# Patient Record
Sex: Female | Born: 1995 | Race: White | Hispanic: No | Marital: Single | Smoking: Former smoker
Health system: Southern US, Community
[De-identification: ages and names within clinical notes are randomized; demographics above are authoritative.]

## PROBLEM LIST (undated history)

## (undated) DIAGNOSIS — Z789 Other specified health status: Secondary | ICD-10-CM

## (undated) DIAGNOSIS — R112 Nausea with vomiting, unspecified: Secondary | ICD-10-CM

## (undated) DIAGNOSIS — Z9889 Other specified postprocedural states: Secondary | ICD-10-CM

## (undated) HISTORY — DX: Other specified postprocedural states: R11.2

## (undated) HISTORY — PX: NO PAST SURGERIES: SHX2092

## (undated) HISTORY — PX: APPENDECTOMY: SHX54

---

## 2018-03-24 DIAGNOSIS — O211 Hyperemesis gravidarum with metabolic disturbance: Secondary | ICD-10-CM | POA: Diagnosis not present

## 2018-03-24 DIAGNOSIS — O99511 Diseases of the respiratory system complicating pregnancy, first trimester: Secondary | ICD-10-CM | POA: Diagnosis not present

## 2018-03-24 DIAGNOSIS — J111 Influenza due to unidentified influenza virus with other respiratory manifestations: Secondary | ICD-10-CM | POA: Diagnosis not present

## 2018-03-24 DIAGNOSIS — O99281 Endocrine, nutritional and metabolic diseases complicating pregnancy, first trimester: Secondary | ICD-10-CM | POA: Diagnosis not present

## 2018-03-24 DIAGNOSIS — J101 Influenza due to other identified influenza virus with other respiratory manifestations: Secondary | ICD-10-CM | POA: Diagnosis not present

## 2018-03-24 DIAGNOSIS — Z3A08 8 weeks gestation of pregnancy: Secondary | ICD-10-CM | POA: Diagnosis not present

## 2018-03-24 DIAGNOSIS — E860 Dehydration: Secondary | ICD-10-CM | POA: Diagnosis not present

## 2018-04-24 DIAGNOSIS — Z87891 Personal history of nicotine dependence: Secondary | ICD-10-CM | POA: Diagnosis not present

## 2018-04-24 DIAGNOSIS — M549 Dorsalgia, unspecified: Secondary | ICD-10-CM | POA: Diagnosis not present

## 2018-04-24 DIAGNOSIS — R1030 Lower abdominal pain, unspecified: Secondary | ICD-10-CM | POA: Diagnosis not present

## 2018-04-24 DIAGNOSIS — R109 Unspecified abdominal pain: Secondary | ICD-10-CM | POA: Diagnosis not present

## 2018-04-24 DIAGNOSIS — O26891 Other specified pregnancy related conditions, first trimester: Secondary | ICD-10-CM | POA: Diagnosis not present

## 2018-04-24 DIAGNOSIS — Z3A12 12 weeks gestation of pregnancy: Secondary | ICD-10-CM | POA: Diagnosis not present

## 2018-04-30 DIAGNOSIS — N912 Amenorrhea, unspecified: Secondary | ICD-10-CM | POA: Diagnosis not present

## 2018-05-06 DIAGNOSIS — Z3492 Encounter for supervision of normal pregnancy, unspecified, second trimester: Secondary | ICD-10-CM | POA: Diagnosis not present

## 2018-07-19 DIAGNOSIS — Z363 Encounter for antenatal screening for malformations: Secondary | ICD-10-CM | POA: Diagnosis not present

## 2018-07-19 DIAGNOSIS — M5441 Lumbago with sciatica, right side: Secondary | ICD-10-CM | POA: Diagnosis not present

## 2018-07-19 DIAGNOSIS — M9903 Segmental and somatic dysfunction of lumbar region: Secondary | ICD-10-CM | POA: Diagnosis not present

## 2018-07-19 DIAGNOSIS — M9902 Segmental and somatic dysfunction of thoracic region: Secondary | ICD-10-CM | POA: Diagnosis not present

## 2018-07-19 DIAGNOSIS — M9905 Segmental and somatic dysfunction of pelvic region: Secondary | ICD-10-CM | POA: Diagnosis not present

## 2018-07-23 DIAGNOSIS — M9903 Segmental and somatic dysfunction of lumbar region: Secondary | ICD-10-CM | POA: Diagnosis not present

## 2018-07-23 DIAGNOSIS — M9905 Segmental and somatic dysfunction of pelvic region: Secondary | ICD-10-CM | POA: Diagnosis not present

## 2018-07-23 DIAGNOSIS — M5441 Lumbago with sciatica, right side: Secondary | ICD-10-CM | POA: Diagnosis not present

## 2018-07-23 DIAGNOSIS — M9902 Segmental and somatic dysfunction of thoracic region: Secondary | ICD-10-CM | POA: Diagnosis not present

## 2018-07-26 DIAGNOSIS — Z36 Encounter for antenatal screening for chromosomal anomalies: Secondary | ICD-10-CM | POA: Diagnosis not present

## 2018-07-26 DIAGNOSIS — Z3A26 26 weeks gestation of pregnancy: Secondary | ICD-10-CM | POA: Diagnosis not present

## 2018-07-26 DIAGNOSIS — M9905 Segmental and somatic dysfunction of pelvic region: Secondary | ICD-10-CM | POA: Diagnosis not present

## 2018-07-26 DIAGNOSIS — M9902 Segmental and somatic dysfunction of thoracic region: Secondary | ICD-10-CM | POA: Diagnosis not present

## 2018-07-26 DIAGNOSIS — Z363 Encounter for antenatal screening for malformations: Secondary | ICD-10-CM | POA: Diagnosis not present

## 2018-07-26 DIAGNOSIS — O350XX0 Maternal care for (suspected) central nervous system malformation in fetus, not applicable or unspecified: Secondary | ICD-10-CM | POA: Diagnosis not present

## 2018-07-26 DIAGNOSIS — M9903 Segmental and somatic dysfunction of lumbar region: Secondary | ICD-10-CM | POA: Diagnosis not present

## 2018-07-26 DIAGNOSIS — M5441 Lumbago with sciatica, right side: Secondary | ICD-10-CM | POA: Diagnosis not present

## 2018-07-29 DIAGNOSIS — M9903 Segmental and somatic dysfunction of lumbar region: Secondary | ICD-10-CM | POA: Diagnosis not present

## 2018-07-29 DIAGNOSIS — M5441 Lumbago with sciatica, right side: Secondary | ICD-10-CM | POA: Diagnosis not present

## 2018-07-29 DIAGNOSIS — M9905 Segmental and somatic dysfunction of pelvic region: Secondary | ICD-10-CM | POA: Diagnosis not present

## 2018-07-29 DIAGNOSIS — M9902 Segmental and somatic dysfunction of thoracic region: Secondary | ICD-10-CM | POA: Diagnosis not present

## 2018-07-31 DIAGNOSIS — M9903 Segmental and somatic dysfunction of lumbar region: Secondary | ICD-10-CM | POA: Diagnosis not present

## 2018-07-31 DIAGNOSIS — M9902 Segmental and somatic dysfunction of thoracic region: Secondary | ICD-10-CM | POA: Diagnosis not present

## 2018-07-31 DIAGNOSIS — M9905 Segmental and somatic dysfunction of pelvic region: Secondary | ICD-10-CM | POA: Diagnosis not present

## 2018-07-31 DIAGNOSIS — M5441 Lumbago with sciatica, right side: Secondary | ICD-10-CM | POA: Diagnosis not present

## 2018-08-05 DIAGNOSIS — M9902 Segmental and somatic dysfunction of thoracic region: Secondary | ICD-10-CM | POA: Diagnosis not present

## 2018-08-05 DIAGNOSIS — M9905 Segmental and somatic dysfunction of pelvic region: Secondary | ICD-10-CM | POA: Diagnosis not present

## 2018-08-05 DIAGNOSIS — M5441 Lumbago with sciatica, right side: Secondary | ICD-10-CM | POA: Diagnosis not present

## 2018-08-05 DIAGNOSIS — M9903 Segmental and somatic dysfunction of lumbar region: Secondary | ICD-10-CM | POA: Diagnosis not present

## 2018-08-09 DIAGNOSIS — Z3480 Encounter for supervision of other normal pregnancy, unspecified trimester: Secondary | ICD-10-CM | POA: Diagnosis not present

## 2018-08-12 DIAGNOSIS — M9902 Segmental and somatic dysfunction of thoracic region: Secondary | ICD-10-CM | POA: Diagnosis not present

## 2018-08-12 DIAGNOSIS — M5441 Lumbago with sciatica, right side: Secondary | ICD-10-CM | POA: Diagnosis not present

## 2018-08-12 DIAGNOSIS — M9903 Segmental and somatic dysfunction of lumbar region: Secondary | ICD-10-CM | POA: Diagnosis not present

## 2018-08-12 DIAGNOSIS — M9905 Segmental and somatic dysfunction of pelvic region: Secondary | ICD-10-CM | POA: Diagnosis not present

## 2018-08-21 DIAGNOSIS — M9903 Segmental and somatic dysfunction of lumbar region: Secondary | ICD-10-CM | POA: Diagnosis not present

## 2018-08-21 DIAGNOSIS — M5441 Lumbago with sciatica, right side: Secondary | ICD-10-CM | POA: Diagnosis not present

## 2018-08-21 DIAGNOSIS — M9902 Segmental and somatic dysfunction of thoracic region: Secondary | ICD-10-CM | POA: Diagnosis not present

## 2018-08-21 DIAGNOSIS — M9905 Segmental and somatic dysfunction of pelvic region: Secondary | ICD-10-CM | POA: Diagnosis not present

## 2018-08-22 DIAGNOSIS — Z23 Encounter for immunization: Secondary | ICD-10-CM | POA: Diagnosis not present

## 2018-08-26 DIAGNOSIS — M9903 Segmental and somatic dysfunction of lumbar region: Secondary | ICD-10-CM | POA: Diagnosis not present

## 2018-08-26 DIAGNOSIS — M5441 Lumbago with sciatica, right side: Secondary | ICD-10-CM | POA: Diagnosis not present

## 2018-08-26 DIAGNOSIS — M9902 Segmental and somatic dysfunction of thoracic region: Secondary | ICD-10-CM | POA: Diagnosis not present

## 2018-08-26 DIAGNOSIS — Z3A31 31 weeks gestation of pregnancy: Secondary | ICD-10-CM | POA: Diagnosis not present

## 2018-08-26 DIAGNOSIS — Z3689 Encounter for other specified antenatal screening: Secondary | ICD-10-CM | POA: Diagnosis not present

## 2018-08-26 DIAGNOSIS — M9905 Segmental and somatic dysfunction of pelvic region: Secondary | ICD-10-CM | POA: Diagnosis not present

## 2018-09-04 DIAGNOSIS — M9905 Segmental and somatic dysfunction of pelvic region: Secondary | ICD-10-CM | POA: Diagnosis not present

## 2018-09-04 DIAGNOSIS — M9903 Segmental and somatic dysfunction of lumbar region: Secondary | ICD-10-CM | POA: Diagnosis not present

## 2018-09-04 DIAGNOSIS — M9902 Segmental and somatic dysfunction of thoracic region: Secondary | ICD-10-CM | POA: Diagnosis not present

## 2018-09-04 DIAGNOSIS — M5441 Lumbago with sciatica, right side: Secondary | ICD-10-CM | POA: Diagnosis not present

## 2018-10-02 DIAGNOSIS — Z3480 Encounter for supervision of other normal pregnancy, unspecified trimester: Secondary | ICD-10-CM | POA: Diagnosis not present

## 2018-10-18 ENCOUNTER — Inpatient Hospital Stay (HOSPITAL_COMMUNITY): Payer: Medicaid Other

## 2018-10-18 ENCOUNTER — Inpatient Hospital Stay (HOSPITAL_COMMUNITY): Payer: Medicaid Other | Admitting: Anesthesiology

## 2018-10-18 ENCOUNTER — Encounter (HOSPITAL_COMMUNITY)
Admission: AD | Disposition: A | Discharge status: Home / Self Care | Payer: Self-pay | Source: Home / Self Care | Attending: Obstetrics and Gynecology

## 2018-10-18 ENCOUNTER — Encounter (HOSPITAL_COMMUNITY): Payer: Self-pay | Admitting: *Deleted

## 2018-10-18 ENCOUNTER — Other Ambulatory Visit: Payer: Self-pay

## 2018-10-18 ENCOUNTER — Inpatient Hospital Stay (HOSPITAL_COMMUNITY)
Admission: AD | Admit: 2018-10-18 | Discharge: 2018-10-21 | DRG: 788 | Disposition: A | Discharge status: Home / Self Care | Payer: Medicaid Other | Attending: Obstetrics and Gynecology | Admitting: Obstetrics and Gynecology

## 2018-10-18 DIAGNOSIS — O9081 Anemia of the puerperium: Secondary | ICD-10-CM | POA: Diagnosis not present

## 2018-10-18 DIAGNOSIS — O26893 Other specified pregnancy related conditions, third trimester: Secondary | ICD-10-CM | POA: Diagnosis present

## 2018-10-18 DIAGNOSIS — O411230 Chorioamnionitis, third trimester, not applicable or unspecified: Secondary | ICD-10-CM | POA: Diagnosis not present

## 2018-10-18 DIAGNOSIS — Z3A38 38 weeks gestation of pregnancy: Secondary | ICD-10-CM

## 2018-10-18 DIAGNOSIS — O99824 Streptococcus B carrier state complicating childbirth: Secondary | ICD-10-CM | POA: Diagnosis not present

## 2018-10-18 DIAGNOSIS — Z3A00 Weeks of gestation of pregnancy not specified: Secondary | ICD-10-CM | POA: Diagnosis not present

## 2018-10-18 DIAGNOSIS — Z87891 Personal history of nicotine dependence: Secondary | ICD-10-CM | POA: Diagnosis not present

## 2018-10-18 DIAGNOSIS — O289 Unspecified abnormal findings on antenatal screening of mother: Secondary | ICD-10-CM | POA: Diagnosis not present

## 2018-10-18 DIAGNOSIS — Z20828 Contact with and (suspected) exposure to other viral communicable diseases: Secondary | ICD-10-CM | POA: Diagnosis present

## 2018-10-18 DIAGNOSIS — O368390 Maternal care for abnormalities of the fetal heart rate or rhythm, unspecified trimester, not applicable or unspecified: Secondary | ICD-10-CM | POA: Diagnosis present

## 2018-10-18 HISTORY — DX: Other specified health status: Z78.9

## 2018-10-18 LAB — CBC
HCT: 35.3 % — ABNORMAL LOW (ref 36.0–46.0)
Hemoglobin: 12 g/dL (ref 12.0–15.0)
MCH: 30.4 pg (ref 26.0–34.0)
MCHC: 34 g/dL (ref 30.0–36.0)
MCV: 89.4 fL (ref 80.0–100.0)
Platelets: 252 10*3/uL (ref 150–400)
RBC: 3.95 MIL/uL (ref 3.87–5.11)
RDW: 13.1 % (ref 11.5–15.5)
WBC: 15.2 10*3/uL — ABNORMAL HIGH (ref 4.0–10.5)
nRBC: 0 % (ref 0.0–0.2)

## 2018-10-18 LAB — TYPE AND SCREEN
ABO/RH(D): A POS
Antibody Screen: NEGATIVE

## 2018-10-18 LAB — SARS CORONAVIRUS 2 BY RT PCR (HOSPITAL ORDER, PERFORMED IN ~~LOC~~ HOSPITAL LAB): SARS Coronavirus 2: NEGATIVE

## 2018-10-18 LAB — ABO/RH: ABO/RH(D): A POS

## 2018-10-18 SURGERY — Surgical Case
Anesthesia: Spinal

## 2018-10-18 MED ORDER — NALBUPHINE HCL 10 MG/ML IJ SOLN: 5.0000 mg | INTRAMUSCULAR | Status: DC | PRN

## 2018-10-18 MED ORDER — SIMETHICONE 80 MG PO CHEW
80.0000 mg | CHEWABLE_TABLET | Freq: Three times a day (TID) | ORAL | Status: DC
  Administered 2018-10-19 – 2018-10-21 (×7): 80 mg via ORAL
  Filled 2018-10-18 (×7): qty 1

## 2018-10-18 MED ORDER — PHENYLEPHRINE HCL-NACL 20-0.9 MG/250ML-% IV SOLN
INTRAVENOUS | Status: DC | PRN
  Administered 2018-10-18: 60 ug/min via INTRAVENOUS

## 2018-10-18 MED ORDER — DIPHENHYDRAMINE HCL 50 MG/ML IJ SOLN
INTRAMUSCULAR | Status: AC
  Filled 2018-10-18: qty 1

## 2018-10-18 MED ORDER — FLEET ENEMA 7-19 GM/118ML RE ENEM: 1.0000 | ENEMA | RECTAL | Status: DC | PRN

## 2018-10-18 MED ORDER — SCOPOLAMINE 1 MG/3DAYS TD PT72
MEDICATED_PATCH | TRANSDERMAL | Status: AC
  Filled 2018-10-18: qty 1

## 2018-10-18 MED ORDER — KETOROLAC TROMETHAMINE 30 MG/ML IJ SOLN
30.0000 mg | Freq: Once | INTRAMUSCULAR | Status: AC | PRN
  Administered 2018-10-18: 30 mg via INTRAVENOUS

## 2018-10-18 MED ORDER — NALBUPHINE HCL 10 MG/ML IJ SOLN: 5.0000 mg | Freq: Once | INTRAMUSCULAR | Status: DC | PRN

## 2018-10-18 MED ORDER — KETOROLAC TROMETHAMINE 30 MG/ML IJ SOLN: 30.0000 mg | Freq: Four times a day (QID) | INTRAMUSCULAR | Status: AC | PRN

## 2018-10-18 MED ORDER — SCOPOLAMINE 1 MG/3DAYS TD PT72: 1.0000 | MEDICATED_PATCH | Freq: Once | TRANSDERMAL | Status: DC

## 2018-10-18 MED ORDER — LACTATED RINGERS IV SOLN
INTRAVENOUS | Status: DC
  Administered 2018-10-18 – 2018-10-19 (×2): via INTRAVENOUS

## 2018-10-18 MED ORDER — HYDROMORPHONE HCL 1 MG/ML IJ SOLN
INTRAMUSCULAR | Status: AC
  Filled 2018-10-18: qty 1

## 2018-10-18 MED ORDER — FENTANYL CITRATE (PF) 100 MCG/2ML IJ SOLN
INTRAMUSCULAR | Status: DC | PRN
  Administered 2018-10-18: 15 ug via INTRATHECAL

## 2018-10-18 MED ORDER — SOD CITRATE-CITRIC ACID 500-334 MG/5ML PO SOLN
30.0000 mL | ORAL | Status: DC | PRN
  Administered 2018-10-18: 30 mL via ORAL
  Filled 2018-10-18: qty 30

## 2018-10-18 MED ORDER — KETOROLAC TROMETHAMINE 30 MG/ML IJ SOLN
INTRAMUSCULAR | Status: AC
  Filled 2018-10-18: qty 1

## 2018-10-18 MED ORDER — PHENYLEPHRINE HCL-NACL 20-0.9 MG/250ML-% IV SOLN
INTRAVENOUS | Status: AC
  Filled 2018-10-18: qty 250

## 2018-10-18 MED ORDER — DEXAMETHASONE SODIUM PHOSPHATE 10 MG/ML IJ SOLN
INTRAMUSCULAR | Status: DC | PRN
  Administered 2018-10-18: 10 mg via INTRAVENOUS

## 2018-10-18 MED ORDER — NALOXONE HCL 4 MG/10ML IJ SOLN
1.0000 ug/kg/h | INTRAVENOUS | Status: DC | PRN
  Filled 2018-10-18: qty 5

## 2018-10-18 MED ORDER — PENICILLIN G 3 MILLION UNITS IVPB - SIMPLE MED
3.0000 10*6.[IU] | INTRAVENOUS | Status: DC
  Filled 2018-10-18 (×2): qty 100

## 2018-10-18 MED ORDER — ONDANSETRON HCL 4 MG/2ML IJ SOLN
INTRAMUSCULAR | Status: AC
  Filled 2018-10-18: qty 2

## 2018-10-18 MED ORDER — SODIUM CHLORIDE 0.9 % IV SOLN
INTRAVENOUS | Status: DC | PRN
  Administered 2018-10-18: 15:00:00 via INTRAVENOUS

## 2018-10-18 MED ORDER — OXYTOCIN 40 UNITS IN NORMAL SALINE INFUSION - SIMPLE MED: 2.5000 [IU]/h | INTRAVENOUS | Status: AC

## 2018-10-18 MED ORDER — WITCH HAZEL-GLYCERIN EX PADS: 1.0000 "application " | MEDICATED_PAD | CUTANEOUS | Status: DC | PRN

## 2018-10-18 MED ORDER — ONDANSETRON HCL 4 MG/2ML IJ SOLN: 4.0000 mg | Freq: Four times a day (QID) | INTRAMUSCULAR | Status: DC | PRN

## 2018-10-18 MED ORDER — HYDROMORPHONE HCL 1 MG/ML IJ SOLN
0.2500 mg | INTRAMUSCULAR | Status: DC | PRN
  Administered 2018-10-18 (×2): 0.5 mg via INTRAVENOUS

## 2018-10-18 MED ORDER — SENNOSIDES-DOCUSATE SODIUM 8.6-50 MG PO TABS
2.0000 | ORAL_TABLET | ORAL | Status: DC
  Administered 2018-10-18 – 2018-10-20 (×3): 2 via ORAL
  Filled 2018-10-18 (×3): qty 2

## 2018-10-18 MED ORDER — NALOXONE HCL 0.4 MG/ML IJ SOLN: 0.4000 mg | INTRAMUSCULAR | Status: DC | PRN

## 2018-10-18 MED ORDER — ACETAMINOPHEN 500 MG PO TABS
1000.0000 mg | ORAL_TABLET | Freq: Four times a day (QID) | ORAL | Status: AC
  Administered 2018-10-18 (×2): 1000 mg via ORAL
  Filled 2018-10-18 (×2): qty 2

## 2018-10-18 MED ORDER — CEFAZOLIN SODIUM-DEXTROSE 2-4 GM/100ML-% IV SOLN
INTRAVENOUS | Status: AC
  Filled 2018-10-18: qty 100

## 2018-10-18 MED ORDER — OXYCODONE-ACETAMINOPHEN 5-325 MG PO TABS: 2.0000 | ORAL_TABLET | ORAL | Status: DC | PRN

## 2018-10-18 MED ORDER — LACTATED RINGERS IV SOLN
INTRAVENOUS | Status: DC | PRN
  Administered 2018-10-18 (×2): via INTRAVENOUS

## 2018-10-18 MED ORDER — FENTANYL-BUPIVACAINE-NACL 0.5-0.125-0.9 MG/250ML-% EP SOLN: 12.0000 mL/h | EPIDURAL | Status: DC | PRN

## 2018-10-18 MED ORDER — LACTATED RINGERS IV SOLN
INTRAVENOUS | Status: DC
  Administered 2018-10-18 (×2): via INTRAVENOUS

## 2018-10-18 MED ORDER — MORPHINE SULFATE (PF) 0.5 MG/ML IJ SOLN
INTRAMUSCULAR | Status: DC | PRN
  Administered 2018-10-18: .15 mg via INTRATHECAL

## 2018-10-18 MED ORDER — PHENYLEPHRINE 40 MCG/ML (10ML) SYRINGE FOR IV PUSH (FOR BLOOD PRESSURE SUPPORT): 80.0000 ug | PREFILLED_SYRINGE | INTRAVENOUS | Status: DC | PRN

## 2018-10-18 MED ORDER — IBUPROFEN 800 MG PO TABS
800.0000 mg | ORAL_TABLET | Freq: Three times a day (TID) | ORAL | Status: DC
  Administered 2018-10-18 – 2018-10-21 (×8): 800 mg via ORAL
  Filled 2018-10-18 (×8): qty 1

## 2018-10-18 MED ORDER — DEXAMETHASONE SODIUM PHOSPHATE 10 MG/ML IJ SOLN
INTRAMUSCULAR | Status: AC
  Filled 2018-10-18: qty 1

## 2018-10-18 MED ORDER — SODIUM CHLORIDE 0.9 % IV SOLN
INTRAVENOUS | Status: DC | PRN
  Administered 2018-10-18: 40 [IU] via INTRAVENOUS

## 2018-10-18 MED ORDER — LACTATED RINGERS IV SOLN: 500.0000 mL | INTRAVENOUS | Status: DC | PRN

## 2018-10-18 MED ORDER — LACTATED RINGERS IV SOLN: 500.0000 mL | Freq: Once | INTRAVENOUS | Status: DC

## 2018-10-18 MED ORDER — SIMETHICONE 80 MG PO CHEW
80.0000 mg | CHEWABLE_TABLET | ORAL | Status: DC
  Administered 2018-10-18 – 2018-10-20 (×3): 80 mg via ORAL
  Filled 2018-10-18 (×3): qty 1

## 2018-10-18 MED ORDER — OXYCODONE-ACETAMINOPHEN 5-325 MG PO TABS: 1.0000 | ORAL_TABLET | ORAL | Status: DC | PRN

## 2018-10-18 MED ORDER — SODIUM CHLORIDE 0.9 % IV SOLN: 5.0000 10*6.[IU] | Freq: Once | INTRAVENOUS | Status: DC

## 2018-10-18 MED ORDER — LIDOCAINE HCL (PF) 1 % IJ SOLN: 30.0000 mL | INTRAMUSCULAR | Status: DC | PRN

## 2018-10-18 MED ORDER — STERILE WATER FOR IRRIGATION IR SOLN
Status: DC | PRN
  Administered 2018-10-18: 1

## 2018-10-18 MED ORDER — SODIUM CHLORIDE 0.9 % IR SOLN
Status: DC | PRN
  Administered 2018-10-18: 1

## 2018-10-18 MED ORDER — ZOLPIDEM TARTRATE 5 MG PO TABS: 5.0000 mg | ORAL_TABLET | Freq: Every evening | ORAL | Status: DC | PRN

## 2018-10-18 MED ORDER — EPHEDRINE 5 MG/ML INJ: 10.0000 mg | INTRAVENOUS | Status: DC | PRN

## 2018-10-18 MED ORDER — CEFAZOLIN SODIUM-DEXTROSE 2-4 GM/100ML-% IV SOLN
2.0000 g | Freq: Once | INTRAVENOUS | Status: AC
  Administered 2018-10-18: 2 g via INTRAVENOUS

## 2018-10-18 MED ORDER — PROMETHAZINE HCL 25 MG/ML IJ SOLN: 6.2500 mg | INTRAMUSCULAR | Status: DC | PRN

## 2018-10-18 MED ORDER — DIPHENHYDRAMINE HCL 25 MG PO CAPS: 25.0000 mg | ORAL_CAPSULE | ORAL | Status: DC | PRN

## 2018-10-18 MED ORDER — SCOPOLAMINE 1 MG/3DAYS TD PT72
MEDICATED_PATCH | TRANSDERMAL | Status: DC | PRN
  Administered 2018-10-18: 1 via TRANSDERMAL

## 2018-10-18 MED ORDER — MORPHINE SULFATE (PF) 0.5 MG/ML IJ SOLN
INTRAMUSCULAR | Status: AC
  Filled 2018-10-18: qty 10

## 2018-10-18 MED ORDER — ONDANSETRON HCL 4 MG/2ML IJ SOLN
INTRAMUSCULAR | Status: DC | PRN
  Administered 2018-10-18: 4 mg via INTRAVENOUS

## 2018-10-18 MED ORDER — SODIUM CHLORIDE 0.9% FLUSH: 3.0000 mL | INTRAVENOUS | Status: DC | PRN

## 2018-10-18 MED ORDER — OXYTOCIN BOLUS FROM INFUSION: 500.0000 mL | Freq: Once | INTRAVENOUS | Status: DC

## 2018-10-18 MED ORDER — ACETAMINOPHEN 325 MG PO TABS: 650.0000 mg | ORAL_TABLET | ORAL | Status: DC | PRN

## 2018-10-18 MED ORDER — MEPERIDINE HCL 25 MG/ML IJ SOLN: 6.2500 mg | INTRAMUSCULAR | Status: DC | PRN

## 2018-10-18 MED ORDER — COCONUT OIL OIL
1.0000 "application " | TOPICAL_OIL | Status: DC | PRN
  Administered 2018-10-20: 1 via TOPICAL

## 2018-10-18 MED ORDER — DIBUCAINE (PERIANAL) 1 % EX OINT: 1.0000 "application " | TOPICAL_OINTMENT | CUTANEOUS | Status: DC | PRN

## 2018-10-18 MED ORDER — MEPERIDINE HCL 25 MG/ML IJ SOLN
INTRAMUSCULAR | Status: AC
  Filled 2018-10-18: qty 1

## 2018-10-18 MED ORDER — TETANUS-DIPHTH-ACELL PERTUSSIS 5-2.5-18.5 LF-MCG/0.5 IM SUSP: 0.5000 mL | Freq: Once | INTRAMUSCULAR | Status: DC

## 2018-10-18 MED ORDER — DIPHENHYDRAMINE HCL 50 MG/ML IJ SOLN
12.5000 mg | INTRAMUSCULAR | Status: DC | PRN
  Administered 2018-10-18: 12.5 mg via INTRAVENOUS

## 2018-10-18 MED ORDER — DIPHENHYDRAMINE HCL 25 MG PO CAPS: 25.0000 mg | ORAL_CAPSULE | Freq: Four times a day (QID) | ORAL | Status: DC | PRN

## 2018-10-18 MED ORDER — FENTANYL-BUPIVACAINE-NACL 0.5-0.125-0.9 MG/250ML-% EP SOLN
EPIDURAL | Status: AC
  Filled 2018-10-18: qty 250

## 2018-10-18 MED ORDER — FENTANYL CITRATE (PF) 100 MCG/2ML IJ SOLN
INTRAMUSCULAR | Status: AC
  Filled 2018-10-18: qty 2

## 2018-10-18 MED ORDER — PRENATAL MULTIVITAMIN CH
1.0000 | ORAL_TABLET | Freq: Every day | ORAL | Status: DC
  Administered 2018-10-19 – 2018-10-20 (×2): 1 via ORAL
  Filled 2018-10-18 (×2): qty 1

## 2018-10-18 MED ORDER — MEPERIDINE HCL 25 MG/ML IJ SOLN
INTRAMUSCULAR | Status: DC | PRN
  Administered 2018-10-18: 12.5 mg via INTRAVENOUS

## 2018-10-18 MED ORDER — SIMETHICONE 80 MG PO CHEW: 80.0000 mg | CHEWABLE_TABLET | ORAL | Status: DC | PRN

## 2018-10-18 MED ORDER — MENTHOL 3 MG MT LOZG: 1.0000 | LOZENGE | OROMUCOSAL | Status: DC | PRN

## 2018-10-18 MED ORDER — BUPIVACAINE IN DEXTROSE 0.75-8.25 % IT SOLN
INTRATHECAL | Status: DC | PRN
  Administered 2018-10-18: 1.4 mL via INTRATHECAL

## 2018-10-18 MED ORDER — DIPHENHYDRAMINE HCL 50 MG/ML IJ SOLN: 12.5000 mg | INTRAMUSCULAR | Status: DC | PRN

## 2018-10-18 MED ORDER — ONDANSETRON HCL 4 MG/2ML IJ SOLN: 4.0000 mg | Freq: Three times a day (TID) | INTRAMUSCULAR | Status: DC | PRN

## 2018-10-18 MED ORDER — OXYTOCIN 40 UNITS IN NORMAL SALINE INFUSION - SIMPLE MED: 2.5000 [IU]/h | INTRAVENOUS | Status: DC

## 2018-10-18 MED ORDER — OXYCODONE-ACETAMINOPHEN 5-325 MG PO TABS
1.0000 | ORAL_TABLET | ORAL | Status: DC | PRN
  Administered 2018-10-19: 2 via ORAL
  Administered 2018-10-19: 1 via ORAL
  Administered 2018-10-19: 2 via ORAL
  Administered 2018-10-20 (×2): 1 via ORAL
  Administered 2018-10-20: 2 via ORAL
  Administered 2018-10-20: 1 via ORAL
  Administered 2018-10-20 – 2018-10-21 (×2): 2 via ORAL
  Filled 2018-10-18: qty 2
  Filled 2018-10-18: qty 1
  Filled 2018-10-18: qty 2
  Filled 2018-10-18: qty 1
  Filled 2018-10-18: qty 2
  Filled 2018-10-18: qty 1
  Filled 2018-10-18 (×3): qty 2

## 2018-10-18 SURGICAL SUPPLY — 35 items
BENZOIN TINCTURE PRP APPL 2/3 (GAUZE/BANDAGES/DRESSINGS) ×3 IMPLANT
CHLORAPREP W/TINT 26ML (MISCELLANEOUS) ×3 IMPLANT
CLAMP CORD UMBIL (MISCELLANEOUS) IMPLANT
CLOSURE WOUND 1/2 X4 (GAUZE/BANDAGES/DRESSINGS) ×1
CLOTH BEACON ORANGE TIMEOUT ST (SAFETY) ×3 IMPLANT
DRSG OPSITE POSTOP 4X10 (GAUZE/BANDAGES/DRESSINGS) ×3 IMPLANT
ELECT REM PT RETURN 9FT ADLT (ELECTROSURGICAL) ×3
ELECTRODE REM PT RTRN 9FT ADLT (ELECTROSURGICAL) ×1 IMPLANT
EXTRACTOR VACUUM M CUP 4 TUBE (SUCTIONS) IMPLANT
EXTRACTOR VACUUM M CUP 4' TUBE (SUCTIONS)
GLOVE BIOGEL PI IND STRL 6.5 (GLOVE) ×1 IMPLANT
GLOVE BIOGEL PI IND STRL 7.0 (GLOVE) ×1 IMPLANT
GLOVE BIOGEL PI INDICATOR 6.5 (GLOVE) ×2
GLOVE BIOGEL PI INDICATOR 7.0 (GLOVE) ×2
GLOVE ECLIPSE 6.5 STRL STRAW (GLOVE) ×3 IMPLANT
GOWN STRL REUS W/TWL LRG LVL3 (GOWN DISPOSABLE) ×6 IMPLANT
KIT ABG SYR 3ML LUER SLIP (SYRINGE) IMPLANT
NEEDLE HYPO 25X5/8 SAFETYGLIDE (NEEDLE) IMPLANT
NS IRRIG 1000ML POUR BTL (IV SOLUTION) ×3 IMPLANT
PACK C SECTION WH (CUSTOM PROCEDURE TRAY) ×3 IMPLANT
PAD ABD 7.5X8 STRL (GAUZE/BANDAGES/DRESSINGS) IMPLANT
PAD OB MATERNITY 4.3X12.25 (PERSONAL CARE ITEMS) ×3 IMPLANT
PENCIL SMOKE EVAC W/HOLSTER (ELECTROSURGICAL) ×3 IMPLANT
RTRCTR C-SECT PINK 25CM LRG (MISCELLANEOUS) ×3 IMPLANT
STRIP CLOSURE SKIN 1/2X4 (GAUZE/BANDAGES/DRESSINGS) ×2 IMPLANT
SUT MON AB 2-0 CT1 27 (SUTURE) ×3 IMPLANT
SUT MON AB 4-0 PS1 27 (SUTURE) IMPLANT
SUT PDS AB 0 CTX 60 (SUTURE) IMPLANT
SUT PLAIN 2 0 XLH (SUTURE) IMPLANT
SUT VIC AB 0 CTX 36 (SUTURE) ×8
SUT VIC AB 0 CTX36XBRD ANBCTRL (SUTURE) ×4 IMPLANT
SUT VIC AB 4-0 KS 27 (SUTURE) IMPLANT
TOWEL OR 17X24 6PK STRL BLUE (TOWEL DISPOSABLE) ×3 IMPLANT
TRAY FOLEY W/BAG SLVR 14FR LF (SET/KITS/TRAYS/PACK) ×3 IMPLANT
WATER STERILE IRR 1000ML POUR (IV SOLUTION) ×3 IMPLANT

## 2018-10-18 NOTE — Anesthesia Postprocedure Evaluation (Signed)
Anesthesia Post Note  Patient: Kristina Robinson  Procedure(s) Performed: CESAREAN SECTION (N/A )     Patient location during evaluation: PACU Anesthesia Type: Spinal Level of consciousness: awake Pain management: pain level controlled Vital Signs Assessment: post-procedure vital signs reviewed and stable Respiratory status: spontaneous breathing Cardiovascular status: stable Postop Assessment: no headache, no backache, spinal receding, patient able to bend at knees and no apparent nausea or vomiting Anesthetic complications: no    Last Vitals:  Vitals:   10/18/18 1605 10/18/18 1615  BP:  108/62  Pulse:  78  Resp:  (!) 22  Temp:  36.8 C  SpO2: 100% 100%    Last Pain:  Vitals:   10/18/18 1615  TempSrc: Oral  PainSc:    Pain Goal:                Epidural/Spinal Function Cutaneous sensation: Tingles (10/18/18 1615), Patient able to flex knees: Yes (10/18/18 1615), Patient able to lift hips off bed: No (10/18/18 1615), Back pain beyond tenderness at insertion site: No (10/18/18 1615), Progressively worsening motor and/or sensory loss: No (10/18/18 1615), Bowel and/or bladder incontinence post epidural: No (10/18/18 1615)  Huston Foley

## 2018-10-18 NOTE — H&P (Signed)
23 y.o. 7025w6d  G1P0 comes in c/o ctx.  In MAU FHT found to be non reactive but no decels.  BPP ordered.  At 24min pt refused any further eval d/t discomfort.  At that time ultrasonographer reported 2/8 but incomplete.    Past Medical History:  Diagnosis Date  . Medical history non-contributory     Past Surgical History:  Procedure Laterality Date  . NO PAST SURGERIES      OB History  Gravida Para Term Preterm AB Living  1            SAB TAB Ectopic Multiple Live Births               # Outcome Date GA Lbr Len/2nd Weight Sex Delivery Anes PTL Lv  1 Current             Social History   Socioeconomic History  . Marital status: Single    Spouse name: Not on file  . Number of children: Not on file  . Years of education: Not on file  . Highest education level: Not on file  Occupational History  . Not on file  Social Needs  . Financial resource strain: Not on file  . Food insecurity    Worry: Not on file    Inability: Not on file  . Transportation needs    Medical: Not on file    Non-medical: Not on file  Tobacco Use  . Smoking status: Former Smoker    Types: Cigarettes  . Smokeless tobacco: Never Used  . Tobacco comment: quit Dec  Substance and Sexual Activity  . Alcohol use: Never    Frequency: Never  . Drug use: Never  . Sexual activity: Yes  Lifestyle  . Physical activity    Days per week: Not on file    Minutes per session: Not on file  . Stress: Not on file  Relationships  . Social Musicianconnections    Talks on phone: Not on file    Gets together: Not on file    Attends religious service: Not on file    Active member of club or organization: Not on file    Attends meetings of clubs or organizations: Not on file    Relationship status: Not on file  . Intimate partner violence    Fear of current or ex partner: Not on file    Emotionally abused: Not on file    Physically abused: Not on file    Forced sexual activity: Not on file  Other Topics Concern  . Not on  file  Social History Narrative  . Not on file   Patient has no known allergies.    Prenatal Transfer Tool  Maternal Diabetes: No Genetic Screening: Declined Maternal Ultrasounds/Referrals: Other: ventriculomegaly, f/b scan by MFM showing no abnormality, but limited  Fetal Ultrasounds or other Referrals:  Referred to Materal Fetal Medicine  Maternal Substance Abuse:  No Significant Maternal Medications:  None Significant Maternal Lab Results: Group B Strep positive  Other PNC: uncomplicated.    Vitals:   10/18/18 1006 10/18/18 1014 10/18/18 1029 10/18/18 1148  BP:  128/80 100/70 133/85  Pulse:  97 91 99  Temp:  98.4 F (36.9 C)    TempSrc:  Oral    SpO2:  100% 100%   Weight: 66.5 kg     Height: 5\' 8"  (1.727 m)       Lungs/Cor:  NAD Abdomen:  soft, gravid Ex:  no cords, erythema SVE:  2.5/80/0  FHTs:  150, min to mod variability, no accels, no decels Toco:  q 2-4   A/P   Admit for early labor with non-reassuring fetal status Unable to complete BPP, but no movement noted Continuous fetal monioring PCN for GBS+ Epidural if desired and other routine care    Kristina Robinson

## 2018-10-18 NOTE — MAU Provider Note (Signed)
First Provider Initiated Contact with Patient 10/18/18 1104       S: Ms. Kristina Robinson is a 23 y.o. G1P0 at [redacted]w[redacted]d  who presents to MAU today complaining contractions q 3 minutes since 0500. She denies vaginal bleeding. She denies LOF. She reports normal fetal movement.    Once patient was placed on the monitor tracing was not reactive. RN asked for me to see the patient.   O: BP 133/85   Pulse 99   Temp 98.4 F (36.9 C) (Oral)   Ht 5\' 8"  (1.727 m)   Wt 66.5 kg   SpO2 100%   BMI 22.31 kg/m  GENERAL: Well-developed, well-nourished female in no acute distress.  HEAD: Normocephalic, atraumatic.  CHEST: Normal effort of breathing, regular heart rate ABDOMEN: Soft, nontender, gravid  Cervical exam:  Dilation: 2.5 Effacement (%): 80 Cervical Position: Middle Station: 0 Exam by:: jolynn   Fetal Monitoring: Baseline: 150 Variability: minimal to moderate Accelerations: none Decelerations: none Contractions: 2-3  Non-reactive tracing   Dr. Rogue Bussing notified. She would like to get BPP.   BPP 2/8 after 24 mins. Patient was then not able to tolerate the exam. However no breathing, tone or movement seen in 24 mins.   Dr. Rogue Bussing updated by the RN   A: [facility] at [redacted]w[redacted]d  early labor  Non-reassuring fetal testing  P: Admit to labor and delivery    Marcille Buffy DNP, CNM  10/18/18  11:57 AM

## 2018-10-18 NOTE — Transfer of Care (Signed)
Immediate Anesthesia Transfer of Care Note  Patient: Kristina Robinson  Procedure(s) Performed: CESAREAN SECTION (N/A )  Patient Location: PACU  Anesthesia Type:Spinal  Level of Consciousness: awake, alert  and oriented  Airway & Oxygen Therapy: Patient Spontanous Breathing  Post-op Assessment: Report given to RN and Post -op Vital signs reviewed and stable  Post vital signs: Reviewed and stable  Last Vitals:  Vitals Value Taken Time  BP 108/55 10/18/18 1520  Temp    Pulse 96 10/18/18 1520  Resp 16 10/18/18 1520  SpO2 100 % 10/18/18 1520  Vitals shown include unvalidated device data.  Last Pain:  Vitals:   10/18/18 1312  TempSrc:   PainSc: 10-Worst pain ever         Complications: No apparent anesthesia complications

## 2018-10-18 NOTE — Brief Op Note (Signed)
10/18/2018  3:15 PM  PATIENT:  Kristina Robinson  23 y.o. female  PRE-OPERATIVE DIAGNOSIS:  nonreassuring fetal heartrate tracing  POST-OPERATIVE DIAGNOSIS:  nonreassuring fetal heartrate tracing   PROCEDURE:  Procedure(s): CESAREAN SECTION (N/A)- low transverse  SURGEON:  Surgeon(s) and Role:    Rogue Bussing, Jini Horiuchi, DO - Primary  FINDINGS: normal appearing uterus and bilateral tubes and ovaries, thick meconium and tight nuchal x2, cephalic, APGARS pending NICU assignment.  ANESTHESIA:   spinal  EBL:  125 mL - est by anesthesia, I believe it is more  SPECIMEN:  Source of Specimen:  placenta and cord blood  DISPOSITION OF SPECIMEN:  PATHOLOGY  COUNTS:  YES  PLAN OF CARE: Admit to inpatient   PATIENT DISPOSITION:  PACU - hemodynamically stable.   Delay start of Pharmacological VTE agent (>24hrs) due to surgical blood loss or risk of bleeding: not applicable

## 2018-10-18 NOTE — MAU Note (Signed)
Started having what she thought was braxton hicks last night.  They woke her during the night,  Have gotten much stronger, doesn't know how often.  Denies leaking or bleeding. Was 1 cm when last checked.

## 2018-10-18 NOTE — Anesthesia Preprocedure Evaluation (Signed)
Anesthesia Evaluation  Patient identified by MRN, date of birth, ID band Patient awake    Reviewed: Allergy & Precautions, H&P , Patient's Chart, lab work & pertinent test results  Airway Mallampati: I  TM Distance: >3 FB Neck ROM: full    Dental no notable dental hx.    Pulmonary former smoker,    Pulmonary exam normal breath sounds clear to auscultation       Cardiovascular negative cardio ROS   Rhythm:regular Rate:Normal     Neuro/Psych negative neurological ROS  negative psych ROS   GI/Hepatic negative GI ROS, Neg liver ROS,   Endo/Other  negative endocrine ROS  Renal/GU negative Renal ROS     Musculoskeletal   Abdominal   Peds  Hematology negative hematology ROS (+)   Anesthesia Other Findings   Reproductive/Obstetrics (+) Pregnancy                             Anesthesia Physical Anesthesia Plan  ASA: II  Anesthesia Plan: Spinal   Post-op Pain Management:    Induction:   PONV Risk Score and Plan: 3 and Ondansetron, Dexamethasone and Scopolamine patch - Pre-op  Airway Management Planned: Natural Airway and Nasal Cannula  Additional Equipment:   Intra-op Plan:   Post-operative Plan:   Informed Consent: I have reviewed the patients History and Physical, chart, labs and discussed the procedure including the risks, benefits and alternatives for the proposed anesthesia with the patient or authorized representative who has indicated his/her understanding and acceptance.       Plan Discussed with: CRNA  Anesthesia Plan Comments:         Anesthesia Quick Evaluation

## 2018-10-18 NOTE — Progress Notes (Signed)
Called by RN reporting deceleration with return to baseline, f/b add'l less deep deceleration.  I arrived at bedside to discuss c/s with patient.  Given non-reactive NSR with minimal variability since arrive to hospital, and BPP incomplete, but at 24 mins 2/8, and cervical exam 3/80/0, remote from delivery I recommend cesarean section and patient agrees.

## 2018-10-18 NOTE — Anesthesia Procedure Notes (Signed)
Spinal  Patient location during procedure: OR Start time: 10/18/2018 2:13 PM End time: 10/18/2018 2:15 PM Staffing Anesthesiologist: Lyn Hollingshead, MD Performed: anesthesiologist  Preanesthetic Checklist Completed: patient identified, site marked, surgical consent, pre-op evaluation, timeout performed, IV checked, risks and benefits discussed and monitors and equipment checked Spinal Block Patient position: sitting Prep: site prepped and draped and DuraPrep Patient monitoring: continuous pulse ox and blood pressure Approach: midline Location: L3-4 Injection technique: single-shot Needle Needle type: Pencan  Needle gauge: 24 G Needle length: 10 cm Needle insertion depth: 4 cm Assessment Sensory level: T4

## 2018-10-18 NOTE — Op Note (Signed)
NAMEBASSHEVA, Kristina Robinson MEDICAL RECORD [id number] ACCOUNT [id number] DATE OF BIRTH:02-02-1996 FACILITY: [facility] LOCATION: [place], DO  OPERATIVE REPORT  DATE OF PROCEDURE:  10/18/2018  PREOPERATIVE DIAGNOSIS:  Term pregnancy, nonreassuring fetal heart tracing.  POSTOPERATIVE DIAGNOSIS:  Term pregnancy, nonreassuring fetal heart tracing, thick meconium, tight double nuchal.  PROCEDURE:  Low transverse cesarean section.  SURGEON:  Allyn Kenner, DO.  FINDINGS:  Normal appearing uterus and bilateral tubes and ovaries, thick meconium and tight nuchal x2, cephalic, Apgars pending, NICU assessment.  ANESTHESIA:  Spinal.  ESTIMATED BLOOD LOSS:  Per OR staff 125 mL.  I believe this to be an underestimate.  SPECIMENS:  Placenta and cord blood.  Counts performed and correct x2.  CONDITION:  Patient stable and to PACU.  DESCRIPTION OF PROCEDURE:  The patient was taken to the operating room where spinal anesthesia was administered and found to be adequate.  She was prepped and draped in normal sterile fashion after listening to fetal heart tones just prior to prep.  A  Pfannenstiel skin incision was made with a scalpel and carried down to underlying layer of fascia with Bovie cautery.  The fascia was incised in the midline with the scalpel and extended laterally with Mayo scissors.  Kocher clamps were placed at the  superior aspect of the fascial incision.  Rectus muscles were dissected off bluntly and sharply.  Kocher clamps were placed at the inferior aspect of the fascial incision.  Rectus muscles were dissected off bluntly and sharply.  The rectus muscles were  separated with a hemostat superiorly and with good visualization.  The peritoneum was entered bluntly.  The peritoneal incision was extended by manual lateral traction.  The abdomen and pelvis were manually surveyed and found to be normal.  An Engineer, site was placed.  Vesicouterine peritoneum  was identified, tented, and entered sharply with Metzenbaum scissors, extended laterally, and the bladder flap was developed digitally.  A low transverse cesarean incision was made with the scalpel and  entered sharply into the amniotic sac where meconium fluid was noted.  The hysterotomy was extended by cephalic and caudal traction.  Infant's head was located, elevated and delivered without difficulty followed by the remainder of the infant's body.   The cord was immediately clamped and cut and the infant was handed off to awaiting neonatology.  A section of cord was saved on the side to collect cord pH.  Cord blood was also collected and gentle traction on the umbilical cord with external uterine  massage was performed to deliver the placenta.  The uterus was cleared of all clot and debris and the uterine incision was closed with Vicryl in a running locked fashion followed by second layer of vertical imbrication.  Excellent hemostasis was again  noted.  The Leia retractor was removed, once again examined and no further bleeding was noted.  The peritoneum was reapproximated and closed with Monocryl in a running fashion followed by two continuous sutures to reapproximate the lower aspect of the  rectus muscle.  Fascia was then reapproximated and closed with Vicryl in a running fashion.  The subcutaneous tissue was irrigated, dried and minimal use of Bovie cautery was needed to control small bleeders.  Skin was then reapproximated and closed with  Vicryl on a Keith needle.  The patient tolerated the procedure well.  Sponge, lap and needle counts were correct x2.  The patient was taken to recovery in stable condition.  TN/NUANCE  D:10/18/2018 T:10/18/2018 JOB:[id number]

## 2018-10-19 ENCOUNTER — Encounter (HOSPITAL_COMMUNITY): Payer: Self-pay | Admitting: Obstetrics and Gynecology

## 2018-10-19 LAB — CBC
HCT: 25.2 % — ABNORMAL LOW (ref 36.0–46.0)
Hemoglobin: 8.5 g/dL — ABNORMAL LOW (ref 12.0–15.0)
MCH: 30.6 pg (ref 26.0–34.0)
MCHC: 33.7 g/dL (ref 30.0–36.0)
MCV: 90.6 fL (ref 80.0–100.0)
Platelets: 221 10*3/uL (ref 150–400)
RBC: 2.78 MIL/uL — ABNORMAL LOW (ref 3.87–5.11)
RDW: 13.3 % (ref 11.5–15.5)
WBC: 19.1 10*3/uL — ABNORMAL HIGH (ref 4.0–10.5)
nRBC: 0 % (ref 0.0–0.2)

## 2018-10-19 LAB — RPR: RPR Ser Ql: NONREACTIVE

## 2018-10-19 MED ORDER — FERROUS FUMARATE 324 (106 FE) MG PO TABS
1.0000 | ORAL_TABLET | Freq: Two times a day (BID) | ORAL | Status: DC
  Administered 2018-10-19 – 2018-10-21 (×5): 106 mg via ORAL
  Filled 2018-10-19 (×5): qty 1

## 2018-10-19 NOTE — Lactation Note (Signed)
This note was copied from a baby's chart. Lactation Consultation Note  Patient Name: Kristina Robinson SHFWY'O Date: 10/19/2018 Reason for consult: Initial assessment;1st time breastfeeding;Early term 37-38.6wks P1, 12 hour female ETI infant.  Mom with C/S delivery,  per mom, she no longer smokes. Per mom, infant been very sleepy and not latching to breast. Per mom, she has DEBP at home and is active on the [facility] in [place].  Mom latched infant on right breast using cross cradle hold, infant would not open mouth wide for latch, LC extended lower jaw infant breastfeed for 5 minutes and stopped. Story notice mom has pseudo-inverted nipples but doesn't need shells or NS.  LC discussed hand expression and mom taught back infant took 8 ml of  colostrum by spoon. Mom knows to hand express and give infant EBM volume,  if infant will not latch at breast.  Mom was doing STS when [facility] left the room. Mom knows to breastfeed according hunger cues, 8 to 12 times within 24 hours and on demand. Mom will continue to work towards latching infant to breast and increase duration of feedings. Mom will ask Nurse or [place] if she has further questions, concerns or need assistance with latching infant to breast. Reviewed Baby & Me book's Breastfeeding Basics.  Mom made aware of O/P services, breastfeeding support groups, community resources, and our phone # for post-discharge questions.    Maternal Data Formula Feeding for Exclusion: No Has patient been taught Hand Expression?: Yes(Infant given 8 ml of colostrum by spoon.)  Feeding Feeding Type: Breast Fed  LATCH Score Latch: Repeated attempts needed to sustain latch, nipple held in mouth throughout feeding, stimulation needed to elicit sucking reflex.  Audible Swallowing: A few with stimulation  Type of Nipple: Inverted  Comfort (Breast/Nipple): Soft / non-tender  Hold (Positioning): Assistance needed to correctly position infant at breast and  maintain latch.  LATCH Score: 5  Interventions Interventions: Breast feeding basics reviewed;Breast compression;Assisted with latch;Adjust position;Skin to skin;Support pillows;Position options;Breast massage;Hand express;Expressed milk  Lactation Tools Discussed/Used     Consult Status Consult Status: Follow-up Date: 10/19/18 Follow-up type: In-patient    [name] 10/19/2018, 3:31 AM

## 2018-10-19 NOTE — Progress Notes (Signed)
Spoke with Dr. Rogue Bussing, aware pt pressure dressing is clean and honeycomb dressing under looks saturated with blood. No new orders at this time. Dr. Rogue Bussing states to wait until morning to re-evaluate.

## 2018-10-19 NOTE — Lactation Note (Signed)
This note was copied from a baby's chart. Lactation Consultation Note  Patient Name: Kristina RobinsonH Date: 10/19/2018 Reason for consult: Follow-up assessment;1st time breastfeeding;Primapara;Early term 37-38.6wks;Infant weight loss  Baby is 24 hours old  And has been sluggish with feedings .  Per mom the best he has done was with LC in the night.  LC showed mom ways to wake the baby.  LC checked diaper , dry. ( has voided and stooled in his life) .  Hand expressed 1st and worked on assisting mom to latch , baby stayed for 4 mins and released sucking his tongue. LC had baby suck on a gloved finger to assess  Sucking and baby has a high palate and a very tight suck. LC showed mom how to do tongue exercises.  Also how to massage is jaw so he will relax enough to  Latch with a wide open mouth,  After assessment attempted to latch again and baby only sucked a few times and off.  LC was unable to spoon feed baby due to crying.  LC ended up having baby suck on a gloved finger and instructed mom on the use of the curved tip syringe while the baby was sucking on the LC finger.  Baby ate 4 ml of EBM .  [place] set up the DEBP and mom pumping with the #24 F and with good results.  LC will report to the night LC for feeding assessment again today.  [name] aware of the [facility] plan.     Maternal Data Has patient been taught Hand Expression?: Yes(4 ml out of the left , 1 ml out of the right ) Does the patient have breastfeeding experience prior to this delivery?: No  Feeding    LATCH Score Latch: Repeated attempts needed to sustain latch, nipple held in mouth throughout feeding, stimulation needed to elicit sucking reflex.(baby sucked for 4 mins and released - noted to suck tongue )  Audible Swallowing: Spontaneous and intermittent  Type of Nipple: Everted at rest and after stimulation  Comfort (Breast/Nipple): Soft / non-tender  Hold (Positioning): Assistance needed to correctly  position infant at breast and maintain latch.  LATCH Score: 8  Interventions Interventions: Breast feeding basics reviewed;Assisted with latch;Skin to skin;Breast massage;Breast compression;Adjust position;Support pillows;Position options;DEBP  Lactation Tools Discussed/Used Tools: Pump;Flanges Flange Size: 24 Breast pump type: Double-Electric Breast Pump Pump Review: Setup, frequency, and cleaning;Milk Storage Initiated by:: MAI  Date initiated:: 10/19/18   Consult Status Consult Status: Follow-up Date: 10/19/18 Follow-up type: In-patient    [place] 10/19/2018, 2:49 PM

## 2018-10-19 NOTE — Lactation Note (Signed)
This note was copied from a baby's chart. Lactation Consultation Note  Patient Name: Kristina Robinson Date: 10/19/2018  P1, 69 hour female infant. Per mom, infant is still not latching opening his mouth, has started to suck on a finger. Parents given infant 15 ml of EBM prior to [facility] entering room. Per mom, she is now using DEBP and pumping every 3 hours and giving infant back volume. Mom will call LC to assist with latch assistance at next feeding.    Maternal Data    Feeding    LATCH Score                   Interventions    Lactation Tools Discussed/Used     Consult Status      [name] 10/19/2018, 9:11 PM

## 2018-10-19 NOTE — Progress Notes (Signed)
Patient is eating, ambulating, voiding.  Incisional pain control is good.  Appropriate lochia, mild right should pain. No other complaints.  Vitals:   10/18/18 1928 10/18/18 2337 10/19/18 0329 10/19/18 0900  BP:  (!) 99/49 (!) 98/48 (!) 108/58  Pulse:  60 64 72  Resp: 18 18 18 20   Temp: 98.3 F (36.8 C) 98.4 F (36.9 C) 98.2 F (36.8 C) 97.9 F (36.6 C)  TempSrc: Oral Oral Oral   SpO2: 100% 99% 97% 100%  Weight:      Height:        Fundus firm Inc: pressure dressing dry, honeycomb with light amount of bleeding over most of dressing, no active bleeding, steri strips loose from moisture.  Dressing removed, and new steri strips and honeycomb applied in sterile fashion, incision well intact.  No pressure dressing reapplied.    Lab Results  Component Value Date   WBC 19.1 (H) 10/19/2018   HGB 8.5 (L) 10/19/2018   HCT 25.2 (L) 10/19/2018   MCV 90.6 10/19/2018   PLT 221 10/19/2018    --/--/A POS, A POS Performed at [facility], [place] [address] [place], [place] [address]  (08/21 1230)  A/P Post op day #1 s/p c/s for NRFHT Post op anemia- add iron bid Asked RN to monitor for additional blood on honeycomb  Routine care.    Allyn Kenner

## 2018-10-20 ENCOUNTER — Encounter (HOSPITAL_COMMUNITY): Payer: Self-pay | Admitting: Nurse Practitioner

## 2018-10-20 NOTE — Progress Notes (Signed)
Patient is eating, ambulating, voiding.  Pain control is good.  Appropriate lochia, no complaints. +flatus and BM.  Vitals:   10/19/18 0900 10/19/18 1525 10/20/18 0000 10/20/18 0500  BP: (!) 108/58 103/62 116/74 112/67  Pulse: 72 86 95 68  Resp: 20 18 18 17   Temp: 97.9 F (36.6 C) (!) 97 F (36.1 C) 97.8 F (36.6 C) 98.2 F (36.8 C)  TempSrc:   Oral Oral  SpO2: 100% 100%    Weight:      Height:        Fundus firm Abd: mildly distended, nontender Inc: c/d/i no drainage Ext: no calf tenderness  Lab Results  Component Value Date   WBC 19.1 (H) 10/19/2018   HGB 8.5 (L) 10/19/2018   HCT 25.2 (L) 10/19/2018   MCV 90.6 10/19/2018   PLT 221 10/19/2018    --/--/A POS, A POS Performed at [facility] [address], [place], [place] [address]  (08/21 1230)  A/P Post op day #2 s/p c/s for NRFHT Circ outpt planned Pt doing well.  Possible d/c later today depending on baby status.  Routine care  Allyn Kenner

## 2018-10-20 NOTE — Lactation Note (Signed)
This note was copied from a baby's chart. Lactation Consultation Note  Patient Name: Kristina Robinson LFYBO'F Date: 10/20/2018 Reason for consult: Infant weight loss;Early term 37-38.6wks;Primapara;1st time breastfeeding  Baby is 24 hours old  Per mom 3 hours since the baby last fed, LC offered to assist with latch, diaper dry.  Asked mom and dad is they had changed diapers today and they said no. Per dad I may have changed a wet and wasn't sure if it was wet. LC recommended saving the diapers and showing the RN.  LC also explained to mom and dad the baby may be behind with fluids due to being so sluggish with latching and feedings the 1st 24 hours.  Mom has pumped this after noon 50 ml and her milk is in.  [facility] praised mom for her efforts breast feeding, pumping and dad for his support.  The MBU RN had mentioned to the [facility] this am that she had observed the baby sucking on his tongue again.  At this feeding , LC checked diaper it was dry and placed baby STS on the left breast / football / and baby opened wider than yesterday and latched.  Mom initially felling discomfort and LC eased baby's chin down to increase depth. Multiple swallows increased with breast compressions, per mom more comfortable. Baby fed for 30 mins and LC had mom release the suction due to baby becoming non - nutritive. Nipple well rounded.  Baby released and fell asleep.   LC reviewed sore nipple and engorgement prevention and tx.  LC instructed mom on the use of the comfort gels after feedings , when warm rinse and place in the refrige, and switched to the breast shells when awake.  LC also recommended prior to latch- breast massage, hand express, pre-pump if needed ( if to full ) and latch with firm support.  If the baby only feed 1st breast , and not the 2nd breast, release down to comfort.  If the baby is having a difficult time latching due to tongue sucking - give an appetizer with and artifical nipple PACE Feeding and  latch.   Baby needs work with opening his mouth wider and LC recommends not using the curved tip syringe - narrows the sucking pattern.  LC will recheck on mom tomorrow.    Maternal Data Has patient been taught Hand Expression?: Yes  Feeding Feeding Type: Breast Fed Nipple Type: Extra Slow Flow  LATCH Score Latch: Grasps breast easily, tongue down, lips flanged, rhythmical sucking.  Audible Swallowing: Spontaneous and intermittent  Type of Nipple: Everted at rest and after stimulation  Comfort (Breast/Nipple): Soft / non-tender  Hold (Positioning): Assistance needed to correctly position infant at breast and maintain latch.  LATCH Score: 9  Interventions Interventions: Breast feeding basics reviewed;Assisted with latch;Skin to skin;Breast massage;Breast compression;Adjust position;Support pillows  Lactation Tools Discussed/Used Tools: Shells;Pump Flange Size: 24;27 Shell Type: Inverted Breast pump type: Double-Electric Breast Pump;Manual WIC Program: Yes Pump Review: Milk Storage;Setup, frequency, and cleaning   Consult Status Consult Status: Follow-up Date: 10/21/18 Follow-up type: In-patient    Laurie 10/20/2018, 2:18 PM

## 2018-10-21 MED ORDER — OXYCODONE-ACETAMINOPHEN 5-325 MG PO TABS: 1.0000 | ORAL_TABLET | ORAL | 0 refills | Status: DC | PRN

## 2018-10-21 NOTE — Discharge Summary (Signed)
Obstetric Discharge Summary Reason for Admission: induction of labor Prenatal Procedures: NST and ultrasound Intrapartum Procedures: cesarean: low cervical, transverse Postpartum Procedures: none Complications-Operative and Postpartum: none Hemoglobin  Date Value Ref Range Status  10/19/2018 8.5 (L) 12.0 - 15.0 g/dL Final    Comment:    REPEATED TO VERIFY   HCT  Date Value Ref Range Status  10/19/2018 25.2 (L) 36.0 - 46.0 % Final     Discharge Diagnoses: Term Pregnancy-delivered  Discharge Information: Date: 10/21/2018 Activity: pelvic rest Diet: routine Medications: Iron and Percocet Condition: stable Instructions: refer to practice specific booklet Discharge to: home Follow-up Information    Allyn Kenner, DO Follow up in 4 week(s).   Specialty: Obstetrics and Gynecology Contact information: [address] [place] [address] [phone]           Newborn Data: Live born female  Birth Weight: 6 lb 13.4 oz (3100 g) APGAR: 1, 2  Newborn Delivery   Birth date/time: 10/18/2018 14:36:00 Delivery type: C-Section, Low Transverse Trial of labor: No C-section categorization: Primary      Home with mother.  Daria Pastures 10/21/2018, 8:02 AM

## 2018-10-21 NOTE — Lactation Note (Addendum)
This note was copied from a baby's chart. Lactation Consultation Note  Patient Name: Kristina Robinson VEHMC'N Date: 10/21/2018  baby is 47 hours old, weight stable at 6 %, and bili check 9.9 at 62 hours.  Per mom left nipple is really sore. LC assessed while baby was getting ready to latch and noted the left nipple to have  Abrasions on face part of the nipple. Milk is in bilaterally. And mom pumped 40 ml and 90 ml.  Baby awake and hungry , LC offered to assist to latch on the right breast football. Baby latched easily and LC eased down on the chin to obtain  Depth. After depth achieved mom more comfortable. Baby fed for 13 mins And softened breast ,  Due to sore nipples LC recommended giving the left nipple a break and breast feed on the right and supplement afterwards with 25 -30 ml and post pump both breast . Give the left 4-5 feedings of a break.   Mom already has shells , comfort gels and hand pump , DEBP kit.  Per mom has DEBP [employer] at home.  LC recommended calling back if nipple soreness has not improved for LC O/P.  Mom expressed she has good understanding of the [facility] plan.  Mom aware of the [facility] resources after D/C .  Per mom has DEBP - [employer] at home.    Maternal Data    Feeding Feeding Type: Breast Fed  LATCH Score Latch: Grasps breast easily, tongue down, lips flanged, rhythmical sucking.  Audible Swallowing: Spontaneous and intermittent  Type of Nipple: Everted at rest and after stimulation  Comfort (Breast/Nipple): Filling, red/small blisters or bruises, mild/mod discomfort  Hold (Positioning): Assistance needed to correctly position infant at breast and maintain latch.  LATCH Score: 8  Interventions Interventions: Breast feeding basics reviewed;Assisted with latch;Skin to skin;Breast massage;Pre-pump if needed;Breast compression;Adjust position;Support pillows;Position options  Lactation Tools Discussed/Used Tools: Pump Flange Size: 24 Shell Type:  Inverted Breast pump type: Manual   Consult Status Consult Status: Complete Follow-up type: In-patient    Mer Rouge 10/21/2018, 10:17 AM

## 2018-10-21 NOTE — Progress Notes (Signed)
  Patient is eating, ambulating, voiding.  Pain control is good.  Vitals:   10/20/18 0000 10/20/18 0500 10/20/18 1438 10/20/18 2206  BP: 116/74 112/67 117/70 121/66  Pulse: 95 68 70 72  Resp: 18 17 18 18   Temp: 97.8 F (36.6 C) 98.2 F (36.8 C) 98.1 F (36.7 C) (!) 97.5 F (36.4 C)  TempSrc: Oral Oral Oral Oral  SpO2:      Weight:      Height:        lungs:   clear to auscultation cor:    RRR Abdomen:  soft, appropriate tenderness, incisions intact and without erythema or exudate ex:    no cords   Lab Results  Component Value Date   WBC 19.1 (H) 10/19/2018   HGB 8.5 (L) 10/19/2018   HCT 25.2 (L) 10/19/2018   MCV 90.6 10/19/2018   PLT 221 10/19/2018    --/--/A POS, A POS Performed at [facility] [address], [place], [place] [address]  (08/21 1230)/RI  A/P    Post operative day 1.  Routine post op and postpartum care.  Expect d/c today.  Percocet for pain control.

## 2018-11-17 ENCOUNTER — Inpatient Hospital Stay (HOSPITAL_COMMUNITY)
Admission: AD | Admit: 2018-11-17 | Payer: Medicaid Other | Source: Home / Self Care | Admitting: Obstetrics and Gynecology

## 2018-11-26 DIAGNOSIS — Z1331 Encounter for screening for depression: Secondary | ICD-10-CM | POA: Diagnosis not present

## 2019-02-28 NOTE — L&D Delivery Note (Signed)
Delivery Note Kristina Robinson is a G2P1001 at [redacted]w[redacted]d who had a vaginal birth after cesarean at 21, a viable female was delivered via  LOA.  APGAR: 8,9 ; weight 8lb12.4oz  .    Admitted in active labor and progressed normally. AROM at 0100. Fully dilated at 0210, pushed for 25 minutes.    Baby was delivered without difficulty. No nuchal cord. Infant placed onto maternal abdomen. Cord clamped and cut after 60 seconds. Placenta delivered spontaneously and intact.   Bilateral periurethral lacerations noted.  0.5cm hematoma noted in the dermal layer of the right periurethral laceration - this was monitored and noted to be nonexpanding and soft. Right periurethral laceration repaired with 3-0 vicryl in a running fashion after infiltration with 1% lidocaine. Left periurethral laceration hemostatic and reapproximated with two interrupted stitches of 3-0 vicryl. Perineum intact.   Placenta: to L&D for disposal Anesthesia:  Epidural, lidocaine Episiotomy:  none Lacerations:  bilateral periurethral Suture Repair: 3.0 vicryl Est. Blood Loss (mL):   Mom to postpartum.  Baby to Couplet care / Skin to Skin.  [name] 02/22/2020, 3:06 AM

## 2019-06-20 ENCOUNTER — Inpatient Hospital Stay (HOSPITAL_COMMUNITY): Payer: Medicaid Other

## 2019-06-20 ENCOUNTER — Other Ambulatory Visit: Payer: Self-pay

## 2019-06-20 ENCOUNTER — Encounter (HOSPITAL_COMMUNITY): Payer: Self-pay | Admitting: Obstetrics and Gynecology

## 2019-06-20 ENCOUNTER — Inpatient Hospital Stay (HOSPITAL_COMMUNITY)
Admission: AD | Admit: 2019-06-20 | Discharge: 2019-06-20 | Disposition: A | Discharge status: Home / Self Care | Payer: Medicaid Other | Attending: Obstetrics and Gynecology | Admitting: Obstetrics and Gynecology

## 2019-06-20 DIAGNOSIS — O4690 Antepartum hemorrhage, unspecified, unspecified trimester: Secondary | ICD-10-CM

## 2019-06-20 DIAGNOSIS — O200 Threatened abortion: Secondary | ICD-10-CM

## 2019-06-20 DIAGNOSIS — O4691 Antepartum hemorrhage, unspecified, first trimester: Secondary | ICD-10-CM | POA: Diagnosis not present

## 2019-06-20 DIAGNOSIS — Z3A01 Less than 8 weeks gestation of pregnancy: Secondary | ICD-10-CM | POA: Diagnosis not present

## 2019-06-20 DIAGNOSIS — O418X10 Other specified disorders of amniotic fluid and membranes, first trimester, not applicable or unspecified: Secondary | ICD-10-CM

## 2019-06-20 DIAGNOSIS — O468X1 Other antepartum hemorrhage, first trimester: Secondary | ICD-10-CM | POA: Diagnosis not present

## 2019-06-20 DIAGNOSIS — O208 Other hemorrhage in early pregnancy: Secondary | ICD-10-CM | POA: Diagnosis not present

## 2019-06-20 LAB — URINALYSIS, ROUTINE W REFLEX MICROSCOPIC
Bacteria, UA: NONE SEEN
Bilirubin Urine: NEGATIVE
Glucose, UA: NEGATIVE mg/dL
Ketones, ur: NEGATIVE mg/dL
Leukocytes,Ua: NEGATIVE
Nitrite: NEGATIVE
Protein, ur: NEGATIVE mg/dL
Specific Gravity, Urine: 1.024 (ref 1.005–1.030)
pH: 5 (ref 5.0–8.0)

## 2019-06-20 LAB — COMPREHENSIVE METABOLIC PANEL
ALT: 15 U/L (ref 0–44)
AST: 19 U/L (ref 15–41)
Albumin: 3.9 g/dL (ref 3.5–5.0)
Alkaline Phosphatase: 44 U/L (ref 38–126)
Anion gap: 9 (ref 5–15)
BUN: 12 mg/dL (ref 6–20)
CO2: 21 mmol/L — ABNORMAL LOW (ref 22–32)
Calcium: 8.8 mg/dL — ABNORMAL LOW (ref 8.9–10.3)
Chloride: 103 mmol/L (ref 98–111)
Creatinine, Ser: 0.57 mg/dL (ref 0.44–1.00)
GFR calc Af Amer: >60 mL/min (ref >60)
GFR calc non Af Amer: >60 mL/min (ref >60)
Glucose, Bld: 87 mg/dL (ref 70–99)
Potassium: 3.5 mmol/L (ref 3.5–5.1)
Sodium: 133 mmol/L — ABNORMAL LOW (ref 135–145)
Total Bilirubin: 0.5 mg/dL (ref 0.3–1.2)
Total Protein: 6.5 g/dL (ref 6.5–8.1)

## 2019-06-20 LAB — CBC
HCT: 38.2 % (ref 36.0–46.0)
Hemoglobin: 12.7 g/dL (ref 12.0–15.0)
MCH: 30 pg (ref 26.0–34.0)
MCHC: 33.2 g/dL (ref 30.0–36.0)
MCV: 90.3 fL (ref 80.0–100.0)
Platelets: 253 10*3/uL (ref 150–400)
RBC: 4.23 MIL/uL (ref 3.87–5.11)
RDW: 12.9 % (ref 11.5–15.5)
WBC: 6 10*3/uL (ref 4.0–10.5)
nRBC: 0 % (ref 0.0–0.2)

## 2019-06-20 LAB — WET PREP, GENITAL
Clue Cells Wet Prep HPF POC: NONE SEEN
Sperm: NONE SEEN
Trich, Wet Prep: NONE SEEN
Yeast Wet Prep HPF POC: NONE SEEN

## 2019-06-20 LAB — POCT PREGNANCY, URINE: Preg Test, Ur: POSITIVE — AB

## 2019-06-20 LAB — HCG, QUANTITATIVE, PREGNANCY: hCG, Beta Chain, Quant, S: 4299 m[IU]/mL — ABNORMAL HIGH (ref <5)

## 2019-06-20 LAB — OB RESULTS CONSOLE GC/CHLAMYDIA: Gonorrhea: NEGATIVE

## 2019-06-20 NOTE — MAU Provider Note (Signed)
Chief Complaint: Vaginal Bleeding and Possible Pregnancy   None     SUBJECTIVE HPI: Kristina Robinson is a 24 y.o. G2P1001 at [redacted]w[redacted]d by LMP who presents to maternity admissions reporting dark red vaginal bleeding with onset today. The bleeding is associated with mild intermittent low abdominal cramping that does not radiate. She has not needed any treatments. There are no other symptoms.    HPI  Past Medical History:  Diagnosis Date  . Medical history non-contributory    Past Surgical History:  Procedure Laterality Date  . CESAREAN SECTION N/A 10/18/2018   Procedure: CESAREAN SECTION;  Surgeon: [name], DO;  Location: [facility];  Service: Obstetrics;  Laterality: N/A;  . NO PAST SURGERIES     Social History   Socioeconomic History  . Marital status: Single    Spouse name: Not on file  . Number of children: Not on file  . Years of education: Not on file  . Highest education level: Not on file  Occupational History  . Not on file  Tobacco Use  . Smoking status: Former Smoker    Types: Cigarettes  . Smokeless tobacco: Never Used  . Tobacco comment: quit Dec  Substance and Sexual Activity  . Alcohol use: Never  . Drug use: Not Currently    Types: Marijuana  . Sexual activity: Yes  Other Topics Concern  . Not on file  Social History Narrative  . Not on file   Social Determinants of Health   Financial Resource Strain:   . Difficulty of Paying Living Expenses:   Food Insecurity:   . Worried About Programme researcher, broadcasting/film/video in the Last Year:   . Barista in the Last Year:   Transportation Needs:   . Freight forwarder (Medical):   Marland Kitchen Lack of Transportation (Non-Medical):   Physical Activity:   . Days of Exercise per Week:   . Minutes of Exercise per Session:   Stress:   . Feeling of Stress :   Social Connections:   . Frequency of Communication with Friends and Family:   . Frequency of Social Gatherings with Friends and Family:   . Attends Religious  Services:   . Active Member of Clubs or Organizations:   . Attends Banker Meetings:   Marland Kitchen Marital Status:   Intimate Partner Violence:   . Fear of Current or Ex-Partner:   . Emotionally Abused:   Marland Kitchen Physically Abused:   . Sexually Abused:    No current facility-administered medications on file prior to encounter.   Current Outpatient Medications on File Prior to Encounter  Medication Sig Dispense Refill  . oxyCODONE-acetaminophen (PERCOCET/ROXICET) 5-325 MG tablet Take 1-2 tablets by mouth every 4 (four) hours as needed for moderate pain. 30 tablet 0   No Known Allergies  ROS:  Review of Systems   I have reviewed patient's Past Medical Hx, Surgical Hx, Family Hx, Social Hx, medications and allergies.   Physical Exam   Patient Vitals for the past 24 hrs:  BP Temp Temp src Pulse Resp SpO2 Height Weight  06/20/19 1734 132/64 98.6 F (37 C) Oral 89 16 100 % 5\' 8"  (1.727 m) 53.4 kg   Constitutional: Well-developed, well-nourished female in no acute distress.  Cardiovascular: normal rate Respiratory: normal effort GI: Abd soft, non-tender. Pos BS x 4 MS: Extremities nontender, no edema, normal ROM Neurologic: Alert and oriented x 4.  GU: Neg CVAT.  PELVIC EXAM: Wet prep/GCC collected by blind swab  LAB RESULTS Results for orders placed or performed during the hospital encounter of 06/20/19 (from the past 24 hour(s))  Pregnancy, urine POC     Status: Abnormal   Collection Time: 06/20/19  5:48 PM  Result Value Ref Range   Preg Test, Ur POSITIVE (A) NEGATIVE  Urinalysis, Routine w reflex microscopic     Status: Abnormal   Collection Time: 06/20/19  5:51 PM  Result Value Ref Range   Color, Urine YELLOW YELLOW   APPearance CLEAR CLEAR   Specific Gravity, Urine 1.024 1.005 - 1.030   pH 5.0 5.0 - 8.0   Glucose, UA NEGATIVE NEGATIVE mg/dL   Hgb urine dipstick LARGE (A) NEGATIVE   Bilirubin Urine NEGATIVE NEGATIVE   Ketones, ur NEGATIVE NEGATIVE mg/dL    Protein, ur NEGATIVE NEGATIVE mg/dL   Nitrite NEGATIVE NEGATIVE   Leukocytes,Ua NEGATIVE NEGATIVE   RBC / HPF 11-20 0 - 5 RBC/hpf   WBC, UA 0-5 0 - 5 WBC/hpf   Bacteria, UA NONE SEEN NONE SEEN   Squamous Epithelial / LPF 0-5 0 - 5   Mucus PRESENT   Wet prep, genital     Status: Abnormal   Collection Time: 06/20/19  8:13 PM  Result Value Ref Range   Yeast Wet Prep HPF POC NONE SEEN NONE SEEN   Trich, Wet Prep NONE SEEN NONE SEEN   Clue Cells Wet Prep HPF POC NONE SEEN NONE SEEN   WBC, Wet Prep HPF POC FEW (A) NONE SEEN   Sperm NONE SEEN   CBC     Status: None   Collection Time: 06/20/19  8:29 PM  Result Value Ref Range   WBC 6.0 4.0 - 10.5 K/uL   RBC 4.23 3.87 - 5.11 MIL/uL   Hemoglobin 12.7 12.0 - 15.0 g/dL   HCT 38.2 36.0 - 46.0 %   MCV 90.3 80.0 - 100.0 fL   MCH 30.0 26.0 - 34.0 pg   MCHC 33.2 30.0 - 36.0 g/dL   RDW 12.9 11.5 - 15.5 %   Platelets 253 150 - 400 K/uL   nRBC 0.0 0.0 - 0.2 %    --/--/A POS, A POS Performed at [facility], [address] [place], [place] [address]  (08/21 1230)  IMAGING US OB LESS THAN 14 WEEKS WITH OB TRANSVAGINAL  Result Date: 06/20/2019 CLINICAL DATA:  Vaginal bleeding EXAM: OBSTETRIC <14 WK [place] AND TRANSVAGINAL OB US TECHNIQUE: Both transabdominal and transvaginal ultrasound examinations were performed for complete evaluation of the gestation as well as the maternal uterus, adnexal regions, and pelvic cul-de-sac. Transvaginal technique was performed to assess early pregnancy. COMPARISON:  None. FINDINGS: Intrauterine gestational sac: Single Yolk sac:  Visualized. Embryo:  Not Visualized. MSD: 6.6 mm, 5 weeks 2 days Subchorionic hemorrhage: Large subchorionic hemorrhage along the left lateral margin of the gestational sac measures 2.4 x 1.5 x 1.4 cm. The hemorrhage covers at least 50% of the circumference of the gestational sac. Maternal uterus/adnexae: Left ovary measures 3.6 x 2.2 x 2.4 cm in the right ovary measures 4.9 x 2.3 x 3.1 cm.  Probable corpus luteum cyst within the right ovary. No free fluid or pelvic mass. IMPRESSION: 1. Intrauterine gestational sac and yolk sac identified, estimated age 26 weeks and 2 days by mean sac diameter. Fetal pole is not yet identified. Serial beta HCG measurements and follow-up ultrasound will be needed to document a live intrauterine pregnancy. 2. Large subchorionic hemorrhage. Electronically Signed   By: Diana Eves.D.  On: 06/20/2019 21:16    MAU Management/MDM: Orders Placed This Encounter  Procedures  . Wet prep, genital  . US OB LESS THAN 14 WEEKS WITH OB TRANSVAGINAL  . US OB Transvaginal  . Urinalysis, Routine w reflex microscopic  . CBC  . Comprehensive metabolic panel  . hCG, quantitative, pregnancy  . Pregnancy, urine POC  . Discharge patient    No orders of the defined types were placed in this encounter.   [place] with confirmed IUP with the present of a yolk sac.  Large SCH noted. Discussed results with pt. Plan for outpatient [place] in 10 days. Pt desires care at [facility].  Return to MAU with heavy bleeding/severe abdominal pain.  Pt discharged with strict bleeding precautions.  ASSESSMENT 1. Subchorionic hemorrhage of placenta in first trimester, single or unspecified fetus   2. Vaginal bleeding in pregnancy   3. Threatened miscarriage     PLAN Discharge home Allergies as of 06/20/2019   No Known Allergies     Medication List    STOP taking these medications   oxyCODONE-acetaminophen 5-325 MG tablet Commonly known as: PERCOCET/ROXICET      Follow-up Information    [facility] Follow up.   Specialty: Obstetrics and Gynecology Why: The address is changing on Jun 30, 2019 to [address] [place], [place] [address], at the corner of [address] and [address].  Phone number [phone].  The office will call you to schedule your ultrasound and follow up. Contact information: [address],  [address] [id number] [place] [id number] [phone]          Sharen Counter Certified Nurse-Midwife 06/20/2019  9:49 PM

## 2019-06-20 NOTE — MAU Note (Signed)
Missed her period.  +HPT 3 days ago.  Started bleeding today. Was cramping a little bit, seems to have stopped.

## 2019-06-23 LAB — GC/CHLAMYDIA PROBE AMP (~~LOC~~) NOT AT ARMC
Chlamydia: NEGATIVE
Comment: NEGATIVE
Comment: NORMAL
Neisseria Gonorrhea: NEGATIVE

## 2019-07-03 ENCOUNTER — Other Ambulatory Visit: Payer: Self-pay

## 2019-07-03 ENCOUNTER — Ambulatory Visit (HOSPITAL_COMMUNITY)
Admission: RE | Admit: 2019-07-03 | Discharge: 2019-07-03 | Disposition: A | Discharge status: Home / Self Care | Payer: Medicaid Other | Source: Ambulatory Visit | Attending: Advanced Practice Midwife | Admitting: Advanced Practice Midwife

## 2019-07-03 ENCOUNTER — Ambulatory Visit (INDEPENDENT_AMBULATORY_CARE_PROVIDER_SITE_OTHER): Payer: Medicaid Other

## 2019-07-03 DIAGNOSIS — Z712 Person consulting for explanation of examination or test findings: Secondary | ICD-10-CM

## 2019-07-03 DIAGNOSIS — O4690 Antepartum hemorrhage, unspecified, unspecified trimester: Secondary | ICD-10-CM

## 2019-07-03 DIAGNOSIS — O468X1 Other antepartum hemorrhage, first trimester: Secondary | ICD-10-CM | POA: Diagnosis not present

## 2019-07-03 DIAGNOSIS — O209 Hemorrhage in early pregnancy, unspecified: Secondary | ICD-10-CM | POA: Diagnosis not present

## 2019-07-03 DIAGNOSIS — Z3A01 Less than 8 weeks gestation of pregnancy: Secondary | ICD-10-CM | POA: Diagnosis not present

## 2019-07-03 DIAGNOSIS — Z3481 Encounter for supervision of other normal pregnancy, first trimester: Secondary | ICD-10-CM

## 2019-07-03 DIAGNOSIS — O418X10 Other specified disorders of amniotic fluid and membranes, first trimester, not applicable or unspecified: Secondary | ICD-10-CM | POA: Diagnosis not present

## 2019-07-03 NOTE — Progress Notes (Signed)
Patient seen and assessed by nursing staff during this encounter. I have reviewed the chart and agree with the documentation and plan. I have also made any necessary editorial changes.  [place] Bing, MD 07/03/2019 3:51 PM

## 2019-07-03 NOTE — Progress Notes (Signed)
Pt here today for pregnancy [place] results for dating.  Reviewed results with Dr. Vergie Living- start [facility] and PNV.  Pt informed that she has a viable pregnancy with EDD 02/21/20, 6w 5d, and FHR 123bpm.  Pt denies vaginal bleeding and pain.  Pt is currently taking a PNV.  Medications/allergies reviewed.  List of medications safe to take in pregnancy given.  [place] pics given.  Proof of pregnancy letter provided by the front office.    Addison Naegeli, RN  07/03/19

## 2019-07-12 DIAGNOSIS — M545 Low back pain: Secondary | ICD-10-CM | POA: Diagnosis not present

## 2019-07-12 DIAGNOSIS — M9905 Segmental and somatic dysfunction of pelvic region: Secondary | ICD-10-CM | POA: Diagnosis not present

## 2019-07-12 DIAGNOSIS — M9902 Segmental and somatic dysfunction of thoracic region: Secondary | ICD-10-CM | POA: Diagnosis not present

## 2019-07-12 DIAGNOSIS — M9903 Segmental and somatic dysfunction of lumbar region: Secondary | ICD-10-CM | POA: Diagnosis not present

## 2019-07-15 DIAGNOSIS — Z131 Encounter for screening for diabetes mellitus: Secondary | ICD-10-CM | POA: Diagnosis not present

## 2019-07-15 DIAGNOSIS — H9202 Otalgia, left ear: Secondary | ICD-10-CM | POA: Diagnosis not present

## 2019-07-15 DIAGNOSIS — R519 Headache, unspecified: Secondary | ICD-10-CM | POA: Diagnosis not present

## 2019-07-15 DIAGNOSIS — R5383 Other fatigue: Secondary | ICD-10-CM | POA: Diagnosis not present

## 2019-07-15 DIAGNOSIS — E7800 Pure hypercholesterolemia, unspecified: Secondary | ICD-10-CM | POA: Diagnosis not present

## 2019-07-15 DIAGNOSIS — Z331 Pregnant state, incidental: Secondary | ICD-10-CM | POA: Diagnosis not present

## 2019-07-29 DIAGNOSIS — O26849 Uterine size-date discrepancy, unspecified trimester: Secondary | ICD-10-CM | POA: Diagnosis not present

## 2019-07-29 DIAGNOSIS — Z3480 Encounter for supervision of other normal pregnancy, unspecified trimester: Secondary | ICD-10-CM | POA: Diagnosis not present

## 2019-07-29 DIAGNOSIS — Z113 Encounter for screening for infections with a predominantly sexual mode of transmission: Secondary | ICD-10-CM | POA: Diagnosis not present

## 2019-07-29 DIAGNOSIS — Z124 Encounter for screening for malignant neoplasm of cervix: Secondary | ICD-10-CM | POA: Diagnosis not present

## 2019-08-12 LAB — OB RESULTS CONSOLE RUBELLA ANTIBODY, IGM: Rubella: IMMUNE

## 2019-08-12 LAB — OB RESULTS CONSOLE HIV ANTIBODY (ROUTINE TESTING): HIV: NONREACTIVE

## 2019-08-12 LAB — OB RESULTS CONSOLE RPR: RPR: NONREACTIVE

## 2019-08-12 LAB — OB RESULTS CONSOLE HEPATITIS B SURFACE ANTIGEN: Hepatitis B Surface Ag: NEGATIVE

## 2019-08-15 DIAGNOSIS — Z3480 Encounter for supervision of other normal pregnancy, unspecified trimester: Secondary | ICD-10-CM | POA: Diagnosis not present

## 2019-08-15 DIAGNOSIS — Z3682 Encounter for antenatal screening for nuchal translucency: Secondary | ICD-10-CM | POA: Diagnosis not present

## 2019-09-11 DIAGNOSIS — Z3482 Encounter for supervision of other normal pregnancy, second trimester: Secondary | ICD-10-CM | POA: Diagnosis not present

## 2019-10-02 DIAGNOSIS — Z363 Encounter for antenatal screening for malformations: Secondary | ICD-10-CM | POA: Diagnosis not present

## 2019-12-01 DIAGNOSIS — Z3480 Encounter for supervision of other normal pregnancy, unspecified trimester: Secondary | ICD-10-CM | POA: Diagnosis not present

## 2019-12-01 DIAGNOSIS — Z23 Encounter for immunization: Secondary | ICD-10-CM | POA: Diagnosis not present

## 2019-12-21 ENCOUNTER — Inpatient Hospital Stay (HOSPITAL_COMMUNITY)
Admission: AD | Admit: 2019-12-21 | Discharge: 2019-12-21 | Disposition: A | Discharge status: Home / Self Care | Payer: Medicaid Other | Attending: Obstetrics & Gynecology | Admitting: Obstetrics & Gynecology

## 2019-12-21 ENCOUNTER — Other Ambulatory Visit: Payer: Self-pay

## 2019-12-21 ENCOUNTER — Encounter (HOSPITAL_COMMUNITY): Payer: Self-pay | Admitting: Obstetrics & Gynecology

## 2019-12-21 DIAGNOSIS — Z3A32 32 weeks gestation of pregnancy: Secondary | ICD-10-CM | POA: Diagnosis not present

## 2019-12-21 DIAGNOSIS — R102 Pelvic and perineal pain: Secondary | ICD-10-CM | POA: Insufficient documentation

## 2019-12-21 DIAGNOSIS — Z87891 Personal history of nicotine dependence: Secondary | ICD-10-CM | POA: Insufficient documentation

## 2019-12-21 DIAGNOSIS — O26893 Other specified pregnancy related conditions, third trimester: Secondary | ICD-10-CM | POA: Diagnosis not present

## 2019-12-21 LAB — URINALYSIS, ROUTINE W REFLEX MICROSCOPIC
Bilirubin Urine: NEGATIVE
Glucose, UA: 150 mg/dL — AB
Hgb urine dipstick: NEGATIVE
Ketones, ur: 20 mg/dL — AB
Leukocytes,Ua: NEGATIVE
Nitrite: NEGATIVE
Protein, ur: NEGATIVE mg/dL
Specific Gravity, Urine: 1.02 (ref 1.005–1.030)
pH: 7 (ref 5.0–8.0)

## 2019-12-21 MED ORDER — CYCLOBENZAPRINE HCL 5 MG PO TABS
10.0000 mg | ORAL_TABLET | Freq: Once | ORAL | Status: AC
  Administered 2019-12-21: 10 mg via ORAL
  Filled 2019-12-21: qty 2

## 2019-12-21 NOTE — Discharge Instructions (Signed)
Soak in a tub with warm water with Epsom salt. You may also take Tylenol 1000 mg every 6 hours as needed for pain.

## 2019-12-21 NOTE — MAU Note (Signed)
Pt reports to MAU complaining of pelvic pain that began on 12/18/2019. Reporting that the pain/pressure has gotten worse today and yesterday rating it a 7/10. Pt reports she has never had this pain before. Denies CTX, vaginal bleeding, or LOF. Reports good fetal movement.

## 2019-12-21 NOTE — MAU Provider Note (Signed)
History     CSN: [id number]  Arrival date and time: 12/21/19 1941   First Provider Initiated Contact with Patient 12/21/19 2037      Chief Complaint  Patient presents with  . Pelvic Pain   Ms. Kristina Robinson is a 24 y.o. year old G75P1001 female at [redacted]w[redacted]d weeks gestation who presents to MAU reporting lower pelvic/groin pain since 12/18/2019. She reports the pain and pressure has gotten worse today and yesterday; pain rated 7/10. She reports the pain increases when fetus is more active. She has never had this pain before. She denies UC's, VB or LOF. She reports good (+) FM. She receives [employer] [facility] [employer] with [facility]; next appt 12/31/2019.   OB History    Gravida  2   Para  1   Term  1   Preterm      AB      Living  1     SAB      TAB      Ectopic      Multiple  0   Live Births  1           Past Medical History:  Diagnosis Date  . Medical history non-contributory     Past Surgical History:  Procedure Laterality Date  . CESAREAN SECTION N/A 10/18/2018   Procedure: CESAREAN SECTION;  Surgeon: [name], DO;  Location: [facility] LD [facility]S;  Service: Obstetrics;  Laterality: N/A;  . NO PAST SURGERIES      Family History  Problem Relation Age of Onset  . Healthy Mother     Social History   Tobacco Use  . Smoking status: Former Smoker    Types: Cigarettes  . Smokeless tobacco: Never Used  . Tobacco comment: quit Dec  Vaping Use  . Vaping Use: Former  . Devices: rare, none since Dec  Substance Use Topics  . Alcohol use: Never  . Drug use: Not Currently    Types: Marijuana    Comment: "couple weeks ago"    Allergies: No Known Allergies  Medications Prior to Admission  Medication Sig Dispense Refill Last Dose  . Prenatal Vit-Fe Fumarate-FA (PREPLUS) 27-1 MG TABS Take 1 tablet by mouth daily.   12/20/2019 at Unknown time    Review of Systems  Constitutional: Negative.   HENT: Negative.   Eyes: Negative.   Respiratory: Negative.    Cardiovascular: Negative.   Gastrointestinal: Negative.   Endocrine: Negative.   Genitourinary: Positive for pelvic pain (and pressure).  Musculoskeletal: Negative.   Skin: Negative.   Allergic/Immunologic: Negative.   Neurological: Negative.   Hematological: Negative.   Psychiatric/Behavioral: Negative.    Physical Exam   Blood pressure 119/64, pulse (!) 101, temperature 98.6 F (37 C), temperature source Oral, resp. rate 16, height 5\' 8"  (1.727 m), weight 66 kg, last menstrual period 05/11/2019, SpO2 97 %, unknown if currently breastfeeding.  Physical Exam Vitals and nursing note reviewed. Exam conducted with a chaperone present.  Constitutional:      Appearance: Normal appearance.  HENT:     Head: Normocephalic and atraumatic.  Cardiovascular:     Rate and Rhythm: Normal rate.     Pulses: Normal pulses.  Pulmonary:     Effort: Pulmonary effort is normal.  Abdominal:     Comments: Gravid S=D  Genitourinary:    Comments: Dilation: Closed Effacement (%): Thick Cervical Position: Posterior Presentation: Undeterminable Exam by: R. Josealberto Montalto,CNM  Musculoskeletal:        General: Normal range of motion.  Cervical back: Normal range of motion.  Skin:    General: Skin is warm and dry.  Neurological:     Mental Status: She is alert and oriented to person, place, and time.  Psychiatric:        Mood and Affect: Mood normal.        Behavior: Behavior normal.        Thought Content: Thought content normal.        Judgment: Judgment normal.    REACTIVE NST - FHR: 135 bpm / moderate variability / accels present / decels absent / TOCO: UI noted  MAU Course  Procedures  MDM CCUA Flexeril 10 mg -- not improved, but "ready to go home"  Results for orders placed or performed during the hospital encounter of 12/21/19 (from the past 24 hour(s))  Urinalysis, Routine w reflex microscopic Urine, Clean Catch     Status: Abnormal   Collection Time: 12/21/19  8:13 PM  Result  Value Ref Range   Color, Urine YELLOW YELLOW   APPearance HAZY (A) CLEAR   Specific Gravity, Urine 1.020 1.005 - 1.030   pH 7.0 5.0 - 8.0   Glucose, UA 150 (A) NEGATIVE mg/dL   Hgb urine dipstick NEGATIVE NEGATIVE   Bilirubin Urine NEGATIVE NEGATIVE   Ketones, ur 20 (A) NEGATIVE mg/dL   Protein, ur NEGATIVE NEGATIVE mg/dL   Nitrite NEGATIVE NEGATIVE   Leukocytes,Ua NEGATIVE NEGATIVE    Assessment and Plan  Pelvic pressure in pregnancy, antepartum, third trimester - Plan: Discharge patient - Advised the groin pain she is experiencing is a normal variation in pregnancy; especially a 2nd pregnancy - Information provided on abdominal pain in pregnancy - Keep scheduled appt with [facility] on 12/31/2019 - Patient verbalized an understanding of the plan of care and agrees.    Raelyn Mora, MSN, CNM 12/21/2019, 8:37 PM

## 2020-01-11 ENCOUNTER — Other Ambulatory Visit: Payer: Self-pay

## 2020-01-11 ENCOUNTER — Inpatient Hospital Stay (HOSPITAL_COMMUNITY): Payer: Medicaid Other

## 2020-01-11 ENCOUNTER — Encounter (HOSPITAL_COMMUNITY): Payer: Self-pay | Admitting: Obstetrics and Gynecology

## 2020-01-11 ENCOUNTER — Inpatient Hospital Stay (HOSPITAL_COMMUNITY)
Admission: AD | Admit: 2020-01-11 | Discharge: 2020-01-12 | Disposition: A | Discharge status: Home / Self Care | Payer: Medicaid Other | Attending: Obstetrics and Gynecology | Admitting: Obstetrics and Gynecology

## 2020-01-11 DIAGNOSIS — J988 Other specified respiratory disorders: Secondary | ICD-10-CM | POA: Diagnosis not present

## 2020-01-11 DIAGNOSIS — Z87891 Personal history of nicotine dependence: Secondary | ICD-10-CM | POA: Diagnosis not present

## 2020-01-11 DIAGNOSIS — R0602 Shortness of breath: Secondary | ICD-10-CM | POA: Diagnosis not present

## 2020-01-11 DIAGNOSIS — Z20822 Contact with and (suspected) exposure to covid-19: Secondary | ICD-10-CM | POA: Diagnosis not present

## 2020-01-11 DIAGNOSIS — O99513 Diseases of the respiratory system complicating pregnancy, third trimester: Secondary | ICD-10-CM | POA: Diagnosis not present

## 2020-01-11 DIAGNOSIS — Z3A35 35 weeks gestation of pregnancy: Secondary | ICD-10-CM | POA: Diagnosis not present

## 2020-01-11 DIAGNOSIS — Z3689 Encounter for other specified antenatal screening: Secondary | ICD-10-CM

## 2020-01-11 DIAGNOSIS — O26893 Other specified pregnancy related conditions, third trimester: Secondary | ICD-10-CM | POA: Diagnosis not present

## 2020-01-11 DIAGNOSIS — R0600 Dyspnea, unspecified: Secondary | ICD-10-CM | POA: Diagnosis not present

## 2020-01-11 DIAGNOSIS — R0981 Nasal congestion: Secondary | ICD-10-CM | POA: Diagnosis present

## 2020-01-11 LAB — CBC WITH DIFFERENTIAL/PLATELET
Abs Immature Granulocytes: 0.14 10*3/uL — ABNORMAL HIGH (ref 0.00–0.07)
Basophils Absolute: 0 10*3/uL (ref 0.0–0.1)
Basophils Relative: 0 %
Eosinophils Absolute: 0.1 10*3/uL (ref 0.0–0.5)
Eosinophils Relative: 1 %
HCT: 26.8 % — ABNORMAL LOW (ref 36.0–46.0)
Hemoglobin: 8.8 g/dL — ABNORMAL LOW (ref 12.0–15.0)
Immature Granulocytes: 1 %
Lymphocytes Relative: 12 %
Lymphs Abs: 1.3 10*3/uL (ref 0.7–4.0)
MCH: 29.3 pg (ref 26.0–34.0)
MCHC: 32.8 g/dL (ref 30.0–36.0)
MCV: 89.3 fL (ref 80.0–100.0)
Monocytes Absolute: 1.1 10*3/uL — ABNORMAL HIGH (ref 0.1–1.0)
Monocytes Relative: 11 %
Neutro Abs: 7.8 10*3/uL — ABNORMAL HIGH (ref 1.7–7.7)
Neutrophils Relative %: 75 %
Platelets: 224 10*3/uL (ref 150–400)
RBC: 3 MIL/uL — ABNORMAL LOW (ref 3.87–5.11)
RDW: 13.2 % (ref 11.5–15.5)
WBC: 10.5 10*3/uL (ref 4.0–10.5)
nRBC: 0 % (ref 0.0–0.2)

## 2020-01-11 LAB — COMPREHENSIVE METABOLIC PANEL
ALT: 13 U/L (ref 0–44)
AST: 16 U/L (ref 15–41)
Albumin: 2.7 g/dL — ABNORMAL LOW (ref 3.5–5.0)
Alkaline Phosphatase: 73 U/L (ref 38–126)
Anion gap: 8 (ref 5–15)
BUN: 6 mg/dL (ref 6–20)
CO2: 24 mmol/L (ref 22–32)
Calcium: 8.8 mg/dL — ABNORMAL LOW (ref 8.9–10.3)
Chloride: 103 mmol/L (ref 98–111)
Creatinine, Ser: 0.53 mg/dL (ref 0.44–1.00)
GFR, Estimated: >60 mL/min (ref >60)
Glucose, Bld: 96 mg/dL (ref 70–99)
Potassium: 3.6 mmol/L (ref 3.5–5.1)
Sodium: 135 mmol/L (ref 135–145)
Total Bilirubin: 0.5 mg/dL (ref 0.3–1.2)
Total Protein: 5.7 g/dL — ABNORMAL LOW (ref 6.5–8.1)

## 2020-01-11 LAB — RESPIRATORY PANEL BY RT PCR (FLU A&B, COVID)
Influenza A by PCR: NEGATIVE
Influenza B by PCR: NEGATIVE
SARS Coronavirus 2 by RT PCR: NEGATIVE

## 2020-01-11 LAB — TROPONIN I (HIGH SENSITIVITY): Troponin I (High Sensitivity): <2 ng/L (ref <18)

## 2020-01-11 LAB — BRAIN NATRIURETIC PEPTIDE: B Natriuretic Peptide: 41.8 pg/mL (ref 0.0–100.0)

## 2020-01-11 MED ORDER — ACETAMINOPHEN 500 MG PO TABS
1000.0000 mg | ORAL_TABLET | Freq: Once | ORAL | Status: AC
  Administered 2020-01-11: 1000 mg via ORAL
  Filled 2020-01-11: qty 2

## 2020-01-11 NOTE — MAU Provider Note (Signed)
Chief Complaint:  Nasal Congestion   First Provider Initiated Contact with Patient 01/11/20 2243     HPI: Kristina Robinson is a 24 y.o. G2P1001 at [redacted]w[redacted]d who presents to maternity admissions reporting nasal congestion, sore throat, chills, body aches, and mild SOB. She was in a wedding 7 days ago and her symptoms started the next day. She is not vaccinated against Covid, nor is anyone in her household. Denies vaginal bleeding, leaking of fluid, decreased fetal movement, fever, falls, or recent illness.   Past Medical History:  Diagnosis Date  . Medical history non-contributory    OB History  Gravida Para Term Preterm AB Living  2 1 1     1   SAB TAB Ectopic Multiple Live Births        0 1    # Outcome Date GA Lbr Len/2nd Weight Sex Delivery Anes PTL Lv  2 Current           1 Term 10/18/18 [redacted]w[redacted]d  6 lb 13.4 oz (3.1 kg) M CS-LTranv Spinal  LIV   Past Surgical History:  Procedure Laterality Date  . CESAREAN SECTION N/A 10/18/2018   Procedure: CESAREAN SECTION;  Surgeon: 10/20/2018, DO;  Location: [facility]S;  Service: Obstetrics;  Laterality: N/A;  . NO PAST SURGERIES     Family History  Problem Relation Age of Onset  . Healthy Mother    Social History   Tobacco Use  . Smoking status: Former Smoker    Types: Cigarettes  . Smokeless tobacco: Never Used  . Tobacco comment: quit Dec  Vaping Use  . Vaping Use: Former  . Devices: rare, none since Dec  Substance Use Topics  . Alcohol use: Never  . Drug use: Not Currently    Types: Marijuana    Comment: September   No Known Allergies No medications prior to admission.    I have reviewed patient's Past Medical Hx, Surgical Hx, Family Hx, Social Hx, medications and allergies.   ROS:  Review of Systems  Constitutional: Positive for chills, fatigue and fever.  HENT: Positive for congestion, postnasal drip, rhinorrhea and sore throat.   Respiratory: Positive for cough and shortness of breath.   Neurological: Negative for  syncope, weakness, light-headedness and headaches.    Physical Exam   Patient Vitals for the past 24 hrs:  BP Temp Temp src Pulse Resp SpO2 Height Weight  01/12/20 0004 116/60 -- -- (!) 115 18 -- -- --  01/12/20 0003 116/60 -- -- (!) 115 -- -- -- --  01/11/20 2207 124/62 99.4 F (37.4 C) Oral (!) 124 17 96 % 5\' 8"  (1.727 m) 150 lb 12.8 oz (68.4 kg)   Constitutional: Well-developed, well-nourished female in mild distress, mostly due to nasal drainage. No cough noted during exam Cardiovascular: normal rate & rhythm, no murmur Respiratory: normal effort, crackles noted through upper right lobes, clear everywhere else and not labored or shallow lung GI: Abd soft, non-tender, gravid appropriate for gestational age. Pos BS x 4 MS: Extremities nontender, no edema, normal ROM Neurologic: Alert and oriented x 4.  Pelvic exam deferred  Fetal Tracing: reactive Baseline: 140 Variability: moderate Accelerations: present Decelerations:none Toco: relaxed   Labs: Results for orders placed or performed during the hospital encounter of 01/11/20 (from the past 24 hour(s))  Respiratory Panel by RT PCR (Flu A&B, Covid) - Nasopharyngeal Swab     Status: None   Collection Time: 01/11/20 10:24 PM   Specimen: Nasopharyngeal Swab  Result Value Ref Range  SARS Coronavirus 2 by RT PCR NEGATIVE NEGATIVE   Influenza A by PCR NEGATIVE NEGATIVE   Influenza B by PCR NEGATIVE NEGATIVE  CBC with Differential/Platelet     Status: Abnormal   Collection Time: 01/11/20 11:06 PM  Result Value Ref Range   WBC 10.5 4.0 - 10.5 K/uL   RBC 3.00 (L) 3.87 - 5.11 MIL/uL   Hemoglobin 8.8 (L) 12.0 - 15.0 g/dL   HCT 62.7 (L) 36 - 46 %   MCV 89.3 80.0 - 100.0 fL   MCH 29.3 26.0 - 34.0 pg   MCHC 32.8 30.0 - 36.0 g/dL   RDW 03.5 00.9 - 38.1 %   Platelets 224 150 - 400 K/uL   nRBC 0.0 0.0 - 0.2 %   Neutrophils Relative % 75 %   Neutro Abs 7.8 (H) 1.7 - 7.7 K/uL   Lymphocytes Relative 12 %   Lymphs Abs 1.3 0.7 - 4.0  K/uL   Monocytes Relative 11 %   Monocytes Absolute 1.1 (H) 0.1 - 1.0 K/uL   Eosinophils Relative 1 %   Eosinophils Absolute 0.1 0.0 - 0.5 K/uL   Basophils Relative 0 %   Basophils Absolute 0.0 0.0 - 0.1 K/uL   Immature Granulocytes 1 %   Abs Immature Granulocytes 0.14 (H) 0.00 - 0.07 K/uL  Brain natriuretic peptide     Status: None   Collection Time: 01/11/20 11:06 PM  Result Value Ref Range   B Natriuretic Peptide 41.8 0.0 - 100.0 pg/mL  Comprehensive metabolic panel     Status: Abnormal   Collection Time: 01/11/20 11:06 PM  Result Value Ref Range   Sodium 135 135 - 145 mmol/L   Potassium 3.6 3.5 - 5.1 mmol/L   Chloride 103 98 - 111 mmol/L   CO2 24 22 - 32 mmol/L   Glucose, Bld 96 70 - 99 mg/dL   BUN 6 6 - 20 mg/dL   Creatinine, Ser 8.29 0.44 - 1.00 mg/dL   Calcium 8.8 (L) 8.9 - 10.3 mg/dL   Total Protein 5.7 (L) 6.5 - 8.1 g/dL   Albumin 2.7 (L) 3.5 - 5.0 g/dL   AST 16 15 - 41 U/L   ALT 13 0 - 44 U/L   Alkaline Phosphatase 73 38 - 126 U/L   Total Bilirubin 0.5 0.3 - 1.2 mg/dL   GFR, Estimated >93 >71 mL/min   Anion gap 8 5 - 15  Troponin I (High Sensitivity)     Status: None   Collection Time: 01/11/20 11:06 PM  Result Value Ref Range   Troponin I (High Sensitivity) <2 <18 ng/L    Imaging:  DG CHEST PORT 1 VIEW  Result Date: 01/11/2020 CLINICAL DATA:  Dyspnea EXAM: PORTABLE CHEST 1 VIEW COMPARISON:  None. FINDINGS: The heart size and mediastinal contours are within normal limits. Both lungs are clear. The visualized skeletal structures are unremarkable. IMPRESSION: No active disease. Electronically Signed   By: Helyn Numbers MD   On: 01/11/2020 23:15    MAU Course: Orders Placed This Encounter  Procedures  . Respiratory Panel by RT PCR (Flu A&B, Covid) - Nasopharyngeal Swab  . DG CHEST PORT 1 VIEW  . CBC with Differential/Platelet  . Brain natriuretic peptide  . Comprehensive metabolic panel  . ED EKG  . Discharge patient   Meds ordered this encounter   Medications  . acetaminophen (TYLENOL) tablet 1,000 mg    MDM: Covid workup, discussed monoclonal antibody treatment if Covid+, pt amenable to receiving therapy here.  Covid workup negative, discussed management of upper respiratory infection with follow up in office if it does not resolve.  Safe medication list given.  Assessment: 1. Respiratory infection   2. Shortness of breath at rest   3. NST (non-stress test) reactive   4. Lab test negative for COVID-19 virus     Plan: Discharge home in stable condition with return precautions.  See AVS for additional education   Follow-up Information    [facility] [place]. Go to.   Why: as scheduled for ongoing prenatal care Contact information: [address] [place] [address] [phone]               Allergies as of 01/12/2020   No Known Allergies     Medication List    TAKE these medications   PrePLUS 27-1 MG Tabs Take 1 tablet by mouth daily.      Edd Arbour, CNM, MSN, [facility] 01/12/20 1:09 AM

## 2020-01-11 NOTE — MAU Note (Signed)
..  Kristina Robinson is a 24 y.o. at [redacted]w[redacted]d here in MAU reporting: Patient reports that a week ago she was in a wedding and since then has had a stuffy nose and sore throat. These symptoms have progressively gotten worse and now she has chills, body aches, and feels short of breath.  She is unaware if she has been exposed to COVID. Has never been diagnosed with COVID and has not received any COVID vaccines. Denies vaginal bleeding or leaking of fluid. +FM Pain score: 0/10 Vitals:   01/11/20 2207  BP: 124/62  Pulse: (!) 124  Resp: 17  Temp: 99.4 F (37.4 C)  SpO2: 96%     FHT: monitors applied 150's

## 2020-01-12 NOTE — Discharge Instructions (Signed)
Safe Medications in Pregnancy   Acne:  Benzoyl Peroxide  Salicylic Acid   Backache/Headache:  Tylenol: 2 regular strength every 4 hours OR        2 Extra strength every 6 hours   Colds/Coughs/Allergies:  Benadryl (alcohol free) 25 mg every 6 hours as needed  Breath right strips  Claritin  Cepacol throat lozenges  Chloraseptic throat spray  Cold-Eeze- up to three times per day  Cough drops, alcohol free  Flonase  Guaifenesin  Mucinex  Robitussin DM (plain only, alcohol free)  Saline nasal spray/drops  Sudafed (pseudoephedrine) & Actifed * use only after [redacted] weeks gestation and if you do not have high blood pressure  Tylenol  Vicks Vaporub  Zinc lozenges  Zyrtec   Constipation:  Colace  Ducolax suppositories  Fleet enema  Glycerin suppositories  Metamucil  Milk of magnesia  Miralax  Senokot  Smooth move tea   Diarrhea:  Kaopectate  Imodium A-D   *NO pepto Bismol   Hemorrhoids:  Anusol  Anusol HC  Preparation H  Tucks   Indigestion:  Tums  Maalox  Mylanta  Zantac  Pepcid   Insomnia:  Benadryl (alcohol free) 25mg  every 6 hours as needed  Tylenol PM  Unisom, no Gelcaps   Leg Cramps:  Tums  MagGel   Nausea/Vomiting:  Bonine  Dramamine  Emetrol  Ginger extract  Sea bands  Meclizine  Nausea medication to take during pregnancy:  Unisom (doxylamine succinate 25 mg tablets) Take one tablet daily at bedtime. If symptoms are not adequately controlled, the dose can be increased to a maximum recommended dose of two tablets daily (1/2 tablet in the morning, 1/2 tablet mid-afternoon and one at bedtime).  Vitamin B6 100mg  tablets. Take one tablet twice a day (up to 200 mg per day).   Skin Rashes:  Aveeno products  Benadryl cream or 25mg  every 6 hours as needed  Calamine Lotion  1% cortisone cream   Yeast infection:  Gyne-lotrimin 7  Monistat 7    **If taking multiple medications, please check labels to avoid duplicating the same active  ingredients  **take medication as directed on the label  ** Do not exceed 4000 mg of tylenol in 24 hours  **Do not take medications that contain aspirin or ibuprofen          Upper Respiratory Infection, Adult An upper respiratory infection (URI) affects the nose, throat, and upper air passages. URIs are caused by germs (viruses). The most common type of URI is often called "the common cold." Medicines cannot cure URIs, but you can do things at home to relieve your symptoms. URIs usually get better within 7-10 days. Follow these instructions at home: Activity  Rest as needed.  If you have a fever, stay home from work or school until your fever is gone, or until your doctor says you may return to work or school. ? You should stay home until you cannot spread the infection anymore (you are not contagious). ? Your doctor may have you wear a face mask so you have less risk of spreading the infection. Relieving symptoms  Gargle with a salt-water mixture 3-4 times a day or as needed. To make a salt-water mixture, completely dissolve -1 tsp of salt in 1 cup of warm water.  Use a cool-mist humidifier to add moisture to the air. This can help you breathe more easily. Eating and drinking   Drink enough fluid to keep your pee (urine) pale yellow.  Eat soups and other  clear broths. General instructions   Take over-the-counter and prescription medicines only as told by your doctor. These include cold medicines, fever reducers, and cough suppressants.  Do not use any products that contain nicotine or tobacco. These include cigarettes and e-cigarettes. If you need help quitting, ask your doctor.  Avoid being where people are smoking (avoid secondhand smoke).  Make sure you get regular shots and get the flu shot every year.  Keep all follow-up visits as told by your doctor. This is important. How to avoid spreading infection to others   Wash your hands often with soap and water. If  you do not have soap and water, use hand sanitizer.  Avoid touching your mouth, face, eyes, or nose.  Cough or sneeze into a tissue or your sleeve or elbow. Do not cough or sneeze into your hand or into the air. Contact a doctor if:  You are getting worse, not better.  You have any of these: ? A fever. ? Chills. ? Brown or red mucus in your nose. ? Yellow or brown fluid (discharge)coming from your nose. ? Pain in your face, especially when you bend forward. ? Swollen neck glands. ? Pain with swallowing. ? White areas in the back of your throat. Get help right away if:  You have shortness of breath that gets worse.  You have very bad or constant: ? Headache. ? Ear pain. ? Pain in your forehead, behind your eyes, and over your cheekbones (sinus pain). ? Chest pain.  You have long-lasting (chronic) lung disease along with any of these: ? Wheezing. ? Long-lasting cough. ? Coughing up blood. ? A change in your usual mucus.  You have a stiff neck.  You have changes in your: ? Vision. ? Hearing. ? Thinking. ? Mood. Summary  An upper respiratory infection (URI) is caused by a germ called a virus. The most common type of URI is often called "the common cold."  URIs usually get better within 7-10 days.  Take over-the-counter and prescription medicines only as told by your doctor. This information is not intended to replace advice given to you by your health care provider. Make sure you discuss any questions you have with your health care provider. Document Revised: 02/21/2018 Document Reviewed: 10/06/2016 Elsevier Patient Education  2020 [employer].  Iron Deficiency Anemia, Adult Iron-deficiency anemia is when you have a low amount of red blood cells or hemoglobin. This happens because you have too little iron in your body. Hemoglobin carries oxygen to parts of the body. Anemia can cause your body to not get enough oxygen. It may or may not cause symptoms. Follow these  instructions at home: Medicines  Take over-the-counter and prescription medicines only as told by your doctor. This includes iron pills (supplements) and vitamins.  If you cannot handle taking iron pills by mouth, ask your doctor about getting iron through: ? A vein (intravenously). ? A shot (injection) into a muscle.  Take iron pills when your stomach is empty. If you cannot handle this, take them with food.  Do not drink milk or take antacids at the same time as your iron pills.  To prevent trouble pooping (constipation), eat fiber or take medicine (stool softener) as told by your doctor. Eating and drinking   Talk with your doctor before changing the foods you eat. He or she may tell you to eat foods that have a lot of iron, such as: ? Liver. ? Lowfat (lean) beef. ? Breads and cereals that  have iron added to them (fortified breads and cereals). ? Eggs. ? Dried fruit. ? Dark green, leafy vegetables.  Drink enough fluid to keep your pee (urine) clear or pale yellow.  Eat fresh fruits and vegetables that are high in vitamin C. They help your body to use iron. Foods with a lot of vitamin C include: ? Oranges. ? Peppers. ? Tomatoes. ? Mangoes. General instructions  Return to your normal activities as told by your doctor. Ask your doctor what activities are safe for you.  Keep yourself clean, and keep things clean around you (your surroundings). Anemia can make you get sick more easily.  Keep all follow-up visits as told by your doctor. This is important. Contact a doctor if:  You feel sick to your stomach (nauseous).  You throw up (vomit).  You feel weak.  You are sweating for no clear reason.  You have trouble pooping, such as: ? Pooping (having a bowel movement) less than 3 times a week. ? Straining to poop. ? Having poop that is hard, dry, or larger than normal. ? Feeling full or bloated. ? Pain in the lower belly. ? Not feeling better after pooping. Get help  right away if:  You pass out (faint). If this happens, do not drive yourself to the hospital. Call your local emergency services (911 in the [place]  You have chest pain.  You have shortness of breath that: ? Is very bad. ? Gets worse with physical activity.  You have a fast heartbeat.  You get light-headed when getting up from sitting or lying down. This information is not intended to replace advice given to you by your health care provider. Make sure you discuss any questions you have with your health care provider. Document Revised: 01/26/2017 Document Reviewed: 11/03/2015 Elsevier Patient Education  2020 [employer].

## 2020-01-21 DIAGNOSIS — O99019 Anemia complicating pregnancy, unspecified trimester: Secondary | ICD-10-CM | POA: Diagnosis not present

## 2020-01-30 DIAGNOSIS — Z3480 Encounter for supervision of other normal pregnancy, unspecified trimester: Secondary | ICD-10-CM | POA: Diagnosis not present

## 2020-01-30 DIAGNOSIS — Z3483 Encounter for supervision of other normal pregnancy, third trimester: Secondary | ICD-10-CM | POA: Diagnosis not present

## 2020-01-30 LAB — OB RESULTS CONSOLE GBS: GBS: POSITIVE

## 2020-02-11 ENCOUNTER — Encounter (HOSPITAL_COMMUNITY): Payer: Self-pay | Admitting: Obstetrics

## 2020-02-11 ENCOUNTER — Inpatient Hospital Stay (HOSPITAL_COMMUNITY)
Admission: AD | Admit: 2020-02-11 | Discharge: 2020-02-11 | Disposition: A | Discharge status: Home / Self Care | Payer: Medicaid Other | Attending: Obstetrics | Admitting: Obstetrics

## 2020-02-11 ENCOUNTER — Other Ambulatory Visit: Payer: Self-pay

## 2020-02-11 DIAGNOSIS — O479 False labor, unspecified: Secondary | ICD-10-CM

## 2020-02-11 DIAGNOSIS — Z3A39 39 weeks gestation of pregnancy: Secondary | ICD-10-CM | POA: Insufficient documentation

## 2020-02-11 DIAGNOSIS — O471 False labor at or after 37 completed weeks of gestation: Secondary | ICD-10-CM | POA: Insufficient documentation

## 2020-02-11 DIAGNOSIS — O26893 Other specified pregnancy related conditions, third trimester: Secondary | ICD-10-CM | POA: Diagnosis present

## 2020-02-11 NOTE — Discharge Instructions (Signed)
Labor and Vaginal Delivery  Vaginal delivery means that you give birth by pushing your baby out of your birth canal (vagina). A team of health care providers will help you before, during, and after vaginal delivery. Birth experiences are unique for every woman and every pregnancy, and birth experiences vary depending on where you choose to give birth. What happens when I arrive at the birth center or hospital? Once you are in labor and have been admitted into the hospital or birth center, your health care provider may:  Review your pregnancy history and any concerns that you have.  Insert an IV into one of your veins. This may be used to give you fluids and medicines.  Check your blood pressure, pulse, temperature, and heart rate (vital signs).  Check whether your bag of water (amniotic sac) has broken (ruptured).  Talk with you about your birth plan and discuss pain control options. Monitoring Your health care provider may monitor your contractions (uterine monitoring) and your baby's heart rate (fetal monitoring). You may need to be monitored:  Often, but not continuously (intermittently).  All the time or for long periods at a time (continuously). Continuous monitoring may be needed if: ? You are taking certain medicines, such as medicine to relieve pain or make your contractions stronger. ? You have pregnancy or labor complications. Monitoring may be done by:  Placing a special stethoscope or a handheld monitoring device on your abdomen to check your baby's heartbeat and to check for contractions.  Placing monitors on your abdomen (external monitors) to record your baby's heartbeat and the frequency and length of contractions.  Placing monitors inside your uterus through your vagina (internal monitors) to record your baby's heartbeat and the frequency, length, and strength of your contractions. Depending on the type of monitor, it may remain in your uterus or on your baby's head  until birth.  Telemetry. This is a type of continuous monitoring that can be done with external or internal monitors. Instead of having to stay in bed, you are able to move around during telemetry. Physical exam Your health care provider may perform frequent physical exams. This may include:  Checking how and where your baby is positioned in your uterus.  Checking your cervix to determine: ? Whether it is thinning out (effacing). ? Whether it is opening up (dilating). What happens during labor and delivery?  Normal labor and delivery is divided into the following three stages: Stage 1  This is the longest stage of labor.  This stage can last for hours or days.  Throughout this stage, you will feel contractions. Contractions generally feel mild, infrequent, and irregular at first. They get stronger, more frequent (about every 2-3 minutes), and more regular as you move through this stage.  This stage ends when your cervix is completely dilated to 4 inches (10 cm) and completely effaced. Stage 2  This stage starts once your cervix is completely effaced and dilated and lasts until the delivery of your baby.  This stage may last from 20 minutes to 2 hours.  This is the stage where you will feel an urge to push your baby out of your vagina.  You may feel stretching and burning pain, especially when the widest part of your baby's head passes through the vaginal opening (crowning).  Once your baby is delivered, the umbilical cord will be clamped and cut. This usually occurs after waiting a period of 1-2 minutes after delivery.  Your baby will be placed on your   bare chest (skin-to-skin contact) in an upright position and covered with a warm blanket. Watch your baby for feeding cues, like rooting or sucking, and help the baby to your breast for his or her first feeding. Stage 3  This stage starts immediately after the birth of your baby and ends after you deliver the placenta.  This  stage may take anywhere from 5 to 30 minutes.  After your baby has been delivered, you will feel contractions as your body expels the placenta and your uterus contracts to control bleeding. What can I expect after labor and delivery?  After labor is over, you and your baby will be monitored closely until you are ready to go home to ensure that you are both healthy. Your health care team will teach you how to care for yourself and your baby.  You and your baby will stay in the same room (rooming in) during your hospital stay. This will encourage early bonding and successful breastfeeding.  You may continue to receive fluids and medicines through an IV.  Your uterus will be checked and massaged regularly (fundal massage).  You will have some soreness and pain in your abdomen, vagina, and the area of skin between your vaginal opening and your anus (perineum).  If an incision was made near your vagina (episiotomy) or if you had some vaginal tearing during delivery, cold compresses may be placed on your episiotomy or your tear. This helps to reduce pain and swelling.  You may be given a squirt bottle to use instead of wiping when you go to the bathroom. To use the squirt bottle, follow these steps: ? Before you urinate, fill the squirt bottle with warm water. Do not use hot water. ? After you urinate, while you are sitting on the toilet, use the squirt bottle to rinse the area around your urethra and vaginal opening. This rinses away any urine and blood. ? Fill the squirt bottle with clean water every time you use the bathroom.  It is normal to have vaginal bleeding after delivery. Wear a sanitary pad for vaginal bleeding and discharge. Summary  Vaginal delivery means that you will give birth by pushing your baby out of your birth canal (vagina).  Your health care provider may monitor your contractions (uterine monitoring) and your baby's heart rate (fetal monitoring).  Your health care  provider may perform a physical exam.  Normal labor and delivery is divided into three stages.  After labor is over, you and your baby will be monitored closely until you are ready to go home. This information is not intended to replace advice given to you by your health care provider. Make sure you discuss any questions you have with your health care provider. Document Revised: 03/20/2017 Document Reviewed: 03/20/2017 Elsevier Patient Education  2020 Elsevier Inc.  

## 2020-02-11 NOTE — MAU Note (Signed)
.. .  Kristina Robinson is a 24 y.o. at [redacted]w[redacted]d here in MAU reporting: CTX that began at 0030 this morning. She reports she had her membranes swept yesterday in the office and was 1cm. CTX currently 3-4 minutes apart. +FM. No VB or LOF.  Pain: 8/10 lower abdominal FHR: 130 initial

## 2020-02-11 NOTE — MAU Note (Signed)
I have communicated with Wynelle Bourgeois, CNM and reviewed vital signs:  Vitals:   02/11/20 0240 02/11/20 0401  BP: 123/84 121/75  Pulse: 87 87  Temp: 98 F (36.7 C)   SpO2: 100%     Vaginal exam:  Dilation: 1.5 Effacement (%): 80 Cervical Position: Posterior Station: -2 Presentation: Vertex Exam by:: Erle Crocker, RN,   Also reviewed contraction pattern and that non-stress test is reactive.  It has been documented that patient is contracting irregularly with no cervical change over one hour not indicating active labor.  Patient denies any other complaints.  Based on this report provider has given order for discharge.  A discharge order and diagnosis entered by a provider.   Labor discharge instructions reviewed with patient.

## 2020-02-11 NOTE — MAU Provider Note (Signed)
S: Ms. Kristina Robinson is a 24 y.o. G2P1001 at [redacted]w[redacted]d  who presents to MAU today for labor evaluation.     Cervical exam by RN:  Dilation: 1.5 Effacement (%): 80 Cervical Position: Posterior Station: -2 Presentation: Vertex Exam by:: Erle Crocker, RN Unchanged over time  Fetal Monitoring: Baseline: 130 Variability: average Accelerations: present Decelerations: absent Contractions: irregular  MDM Discussed patient with RN. NST reviewed.   A: [facility] at [redacted]w[redacted]d  False labor  P: Discharge home Labor precautions and kick counts included in AVS Patient to follow-up with office as scheduled  Patient may return to MAU as needed or when in labor   Aviva Signs, [place] 02/11/2020 3:59 AM

## 2020-02-21 ENCOUNTER — Inpatient Hospital Stay (HOSPITAL_COMMUNITY): Payer: Medicaid Other | Admitting: Anesthesiology

## 2020-02-21 ENCOUNTER — Encounter (HOSPITAL_COMMUNITY): Payer: Self-pay | Admitting: Obstetrics and Gynecology

## 2020-02-21 ENCOUNTER — Inpatient Hospital Stay (HOSPITAL_COMMUNITY)
Admission: AD | Admit: 2020-02-21 | Discharge: 2020-02-23 | DRG: 807 | Disposition: A | Discharge status: Home / Self Care | Payer: Medicaid Other | Attending: Obstetrics and Gynecology | Admitting: Obstetrics and Gynecology

## 2020-02-21 ENCOUNTER — Other Ambulatory Visit: Payer: Self-pay

## 2020-02-21 DIAGNOSIS — Z20822 Contact with and (suspected) exposure to covid-19: Secondary | ICD-10-CM | POA: Diagnosis not present

## 2020-02-21 DIAGNOSIS — Z87891 Personal history of nicotine dependence: Secondary | ICD-10-CM | POA: Diagnosis not present

## 2020-02-21 DIAGNOSIS — O479 False labor, unspecified: Secondary | ICD-10-CM | POA: Diagnosis present

## 2020-02-21 DIAGNOSIS — O9902 Anemia complicating childbirth: Secondary | ICD-10-CM | POA: Diagnosis present

## 2020-02-21 DIAGNOSIS — O34219 Maternal care for unspecified type scar from previous cesarean delivery: Secondary | ICD-10-CM

## 2020-02-21 DIAGNOSIS — Z3A40 40 weeks gestation of pregnancy: Secondary | ICD-10-CM

## 2020-02-21 DIAGNOSIS — O99824 Streptococcus B carrier state complicating childbirth: Principal | ICD-10-CM | POA: Diagnosis present

## 2020-02-21 DIAGNOSIS — O26893 Other specified pregnancy related conditions, third trimester: Secondary | ICD-10-CM | POA: Diagnosis not present

## 2020-02-21 LAB — CBC
HCT: 36.1 % (ref 36.0–46.0)
Hemoglobin: 12 g/dL (ref 12.0–15.0)
MCH: 29.1 pg (ref 26.0–34.0)
MCHC: 33.2 g/dL (ref 30.0–36.0)
MCV: 87.6 fL (ref 80.0–100.0)
Platelets: 278 10*3/uL (ref 150–400)
RBC: 4.12 MIL/uL (ref 3.87–5.11)
RDW: 16.2 % — ABNORMAL HIGH (ref 11.5–15.5)
WBC: 13.6 10*3/uL — ABNORMAL HIGH (ref 4.0–10.5)
nRBC: 0 % (ref 0.0–0.2)

## 2020-02-21 LAB — TYPE AND SCREEN
ABO/RH(D): A POS
Antibody Screen: NEGATIVE

## 2020-02-21 LAB — RESP PANEL BY RT-PCR (FLU A&B, COVID) ARPGX2
Influenza A by PCR: NEGATIVE
Influenza B by PCR: NEGATIVE
SARS Coronavirus 2 by RT PCR: NEGATIVE

## 2020-02-21 MED ORDER — PENICILLIN G POT IN DEXTROSE 60000 UNIT/ML IV SOLN
3.0000 10*6.[IU] | INTRAVENOUS | Status: DC
  Administered 2020-02-22: 01:00:00 3 10*6.[IU] via INTRAVENOUS
  Filled 2020-02-21: qty 50

## 2020-02-21 MED ORDER — PHENYLEPHRINE 40 MCG/ML (10ML) SYRINGE FOR IV PUSH (FOR BLOOD PRESSURE SUPPORT): 80.0000 ug | PREFILLED_SYRINGE | INTRAVENOUS | Status: DC | PRN

## 2020-02-21 MED ORDER — SOD CITRATE-CITRIC ACID 500-334 MG/5ML PO SOLN: 30.0000 mL | ORAL | Status: DC | PRN

## 2020-02-21 MED ORDER — OXYTOCIN-SODIUM CHLORIDE 30-0.9 UT/500ML-% IV SOLN
2.5000 [IU]/h | INTRAVENOUS | Status: DC
  Filled 2020-02-21: qty 500

## 2020-02-21 MED ORDER — LACTATED RINGERS IV SOLN
500.0000 mL | INTRAVENOUS | Status: DC | PRN
Start: 2020-02-21 — End: 2020-02-22

## 2020-02-21 MED ORDER — SODIUM CHLORIDE 0.9 % IV SOLN
5.0000 10*6.[IU] | Freq: Once | INTRAVENOUS | Status: AC
  Administered 2020-02-21: 21:00:00 5 10*6.[IU] via INTRAVENOUS
  Filled 2020-02-21: qty 5

## 2020-02-21 MED ORDER — LIDOCAINE HCL (PF) 1 % IJ SOLN
INTRAMUSCULAR | Status: DC | PRN
  Administered 2020-02-21 (×2): 4 mL via EPIDURAL

## 2020-02-21 MED ORDER — ACETAMINOPHEN 325 MG PO TABS: 650.0000 mg | ORAL_TABLET | ORAL | Status: DC | PRN

## 2020-02-21 MED ORDER — ONDANSETRON HCL 4 MG/2ML IJ SOLN: 4.0000 mg | Freq: Four times a day (QID) | INTRAMUSCULAR | Status: DC | PRN

## 2020-02-21 MED ORDER — LACTATED RINGERS IV SOLN: 500.0000 mL | Freq: Once | INTRAVENOUS | Status: DC

## 2020-02-21 MED ORDER — SODIUM CHLORIDE (PF) 0.9 % IJ SOLN
INTRAMUSCULAR | Status: DC | PRN
  Administered 2020-02-21: 12 mL/h via EPIDURAL

## 2020-02-21 MED ORDER — LACTATED RINGERS IV SOLN: INTRAVENOUS | Status: DC

## 2020-02-21 MED ORDER — FENTANYL CITRATE (PF) 100 MCG/2ML IJ SOLN: 50.0000 ug | INTRAMUSCULAR | Status: DC | PRN

## 2020-02-21 MED ORDER — EPHEDRINE 5 MG/ML INJ: 10.0000 mg | INTRAVENOUS | Status: DC | PRN

## 2020-02-21 MED ORDER — OXYCODONE-ACETAMINOPHEN 5-325 MG PO TABS
1.0000 | ORAL_TABLET | ORAL | Status: DC | PRN
Start: 2020-02-21 — End: 2020-02-22

## 2020-02-21 MED ORDER — DIPHENHYDRAMINE HCL 50 MG/ML IJ SOLN
12.5000 mg | INTRAMUSCULAR | Status: DC | PRN
Start: 2020-02-21 — End: 2020-02-22

## 2020-02-21 MED ORDER — OXYTOCIN BOLUS FROM INFUSION
333.0000 mL | Freq: Once | INTRAVENOUS | Status: AC
  Administered 2020-02-22: 03:00:00 333 mL via INTRAVENOUS

## 2020-02-21 MED ORDER — OXYCODONE-ACETAMINOPHEN 5-325 MG PO TABS: 2.0000 | ORAL_TABLET | ORAL | Status: DC | PRN

## 2020-02-21 MED ORDER — LIDOCAINE HCL (PF) 1 % IJ SOLN
30.0000 mL | INTRAMUSCULAR | Status: AC | PRN
  Administered 2020-02-22: 30 mL via SUBCUTANEOUS
  Filled 2020-02-21: qty 30

## 2020-02-21 MED ORDER — FENTANYL-BUPIVACAINE-NACL 0.5-0.125-0.9 MG/250ML-% EP SOLN
12.0000 mL/h | EPIDURAL | Status: DC | PRN
  Filled 2020-02-21: qty 250

## 2020-02-21 NOTE — Anesthesia Preprocedure Evaluation (Signed)
Anesthesia Evaluation  Patient identified by MRN, date of birth, ID band Patient awake    Reviewed: Allergy & Precautions, Patient's Chart, lab work & pertinent test results  History of Anesthesia Complications Negative for: history of anesthetic complications  Airway Mallampati: II  TM Distance: >3 FB Neck ROM: Full    Dental no notable dental hx.    Pulmonary former smoker,    Pulmonary exam normal        Cardiovascular negative cardio ROS Normal cardiovascular exam     Neuro/Psych negative neurological ROS  negative psych ROS   GI/Hepatic negative GI ROS, Neg liver ROS,   Endo/Other  negative endocrine ROS  Renal/GU negative Renal ROS     Musculoskeletal negative musculoskeletal ROS (+)   Abdominal   Peds  Hematology negative hematology ROS (+)   Anesthesia Other Findings   Reproductive/Obstetrics (+) Pregnancy (Hx of C/S x1)                             Anesthesia Physical Anesthesia Plan  ASA: II  Anesthesia Plan: Epidural   Post-op Pain Management:    Induction:   PONV Risk Score and Plan: Treatment may vary due to age or medical condition  Airway Management Planned: Natural Airway  Additional Equipment:   Intra-op Plan:   Post-operative Plan:   Informed Consent: I have reviewed the patients History and Physical, chart, labs and discussed the procedure including the risks, benefits and alternatives for the proposed anesthesia with the patient or authorized representative who has indicated his/her understanding and acceptance.       Plan Discussed with:   Anesthesia Plan Comments:         Anesthesia Quick Evaluation

## 2020-02-21 NOTE — Anesthesia Procedure Notes (Signed)
Epidural Patient location during procedure: OB Start time: 02/21/2020 9:02 PM End time: 02/21/2020 9:05 PM  Staffing Anesthesiologist: Kaylyn Layer, MD Performed: anesthesiologist   Preanesthetic Checklist Completed: patient identified, IV checked, risks and benefits discussed, monitors and equipment checked, pre-op evaluation and timeout performed  Epidural Patient position: sitting Prep: DuraPrep and site prepped and draped Patient monitoring: continuous pulse ox, blood pressure and heart rate Approach: midline Location: L3-L4 Injection technique: LOR air  Needle:  Needle type: Tuohy  Needle gauge: 17 G Needle length: 9 cm Needle insertion depth: 4 cm Catheter type: closed end flexible Catheter size: 19 Gauge Catheter at skin depth: 8 cm Test dose: negative and Other (1% lidocaine)  Assessment Events: blood not aspirated, injection not painful, no injection resistance, no paresthesia and negative IV test  Additional Notes Patient identified. Risks, benefits, and alternatives discussed with patient including but not limited to bleeding, infection, nerve damage, paralysis, failed block, incomplete pain control, headache, blood pressure changes, nausea, vomiting, reactions to medication, itching, and postpartum back pain. Confirmed with bedside nurse the patient's most recent platelet count. Confirmed with patient that they are not currently taking any anticoagulation, have any bleeding history, or any family history of bleeding disorders. Patient expressed understanding and wished to proceed. All questions were answered. Sterile technique was used throughout the entire procedure. Please see nursing notes for vital signs.   Crisp LOR on first pass. Test dose was given through epidural catheter and negative prior to continuing to dose epidural or start infusion. Warning signs of high block given to the patient including shortness of breath, tingling/numbness in hands, complete  motor block, or any concerning symptoms with instructions to call for help. Patient was given instructions on fall risk and not to get out of bed. All questions and concerns addressed with instructions to call with any issues or inadequate analgesia.  Reason for block:procedure for pain

## 2020-02-21 NOTE — H&P (Signed)
Kristina Robinson is a 24 y.o. female G2P1001at [redacted]w[redacted]d presenting for contractions. She denies LOF, VB. Normal FM.   Pregnancy c/b: 1. History of c-section in Aug 2020 for fetal distress: desires TOLAC 2. GBS positive  3. Anemia of pregnancy: Hgb 9.2 on 01/21/20  OB History    Gravida  2   Para  1   Term  1   Preterm      AB      Living  1     SAB      IAB      Ectopic      Multiple  0   Live Births  1          Past Medical History:  Diagnosis Date  . Medical history non-contributory    Past Surgical History:  Procedure Laterality Date  . CESAREAN SECTION N/A 10/18/2018   Procedure: CESAREAN SECTION;  Surgeon: [name], DO;  Location: [facility] LD ORS;  Service: Obstetrics;  Laterality: N/A;  . NO PAST SURGERIES     Family History: family history includes Healthy in her mother. Social History:  reports that she has quit smoking. Her smoking use included cigarettes. She has never used smokeless tobacco. She reports previous drug use. Drug: Marijuana. She reports that she does not drink alcohol.     Maternal Diabetes: No Genetic Screening: Normal Maternal Ultrasounds/Referrals: Normal Fetal Ultrasounds or other Referrals:  None Maternal Substance Abuse:  No Significant Maternal Medications:  None Significant Maternal Lab Results:  Group B Strep positive Other Comments:  None  Review of Systems Per HPI Exam Physical Exam  Dilation: 4 Effacement (%): 90 Station: -2 Exam by:: [employer], RN Blood pressure 130/78, pulse (!) 104, temperature 98.5 F (36.9 C), temperature source Oral, resp. rate 19, weight 68.3 kg, last menstrual period 05/11/2019, unknown if currently breastfeeding. NAD, resting comfortably s/p epidural Gravid abdomen, [place] 7lbs Fetal testing: FHR 135bpm, moderate variability, + accels, no decels Toco: ctx q 4 mins Prenatal labs: ABO, Rh:  --/--/A POS (12/25 2033) Antibody: NEG (12/25 2033) Rubella: Immune (06/15 0000) RPR:  Nonreactive (06/15 0000)  HBsAg: Negative (06/15 0000)  HIV: Non-reactive (06/15 0000)  GBS: Positive/-- (12/03 0000)   Assessment/Plan: 24Y G2P1001 @ [redacted]w[redacted]d, labor 1. TOLAC: patient has signed TOLAC consent, again reviewed risks/benefits of TOLAC vs. Repeat c-section, most significantly risk of uterine rupture resulting in neonatal or maternal morbidity or mortality. Patient understands risks and wishes to proceed with TOLAC. - Currently contracting regularly, continue expectant management 2. GBS positive: penicillin 3. Pain control: epidural 4. Anemia: admission hgb 12.0   [name] 02/21/2020, 9:24 PM

## 2020-02-21 NOTE — MAU Note (Signed)
CTX 4 mins apart.  Denies LOF/VB.  + FM.  1.5-2 cm last exam.  Denies complications w/ her pregnancy.  GBS +

## 2020-02-22 ENCOUNTER — Encounter (HOSPITAL_COMMUNITY): Payer: Self-pay | Admitting: Obstetrics and Gynecology

## 2020-02-22 DIAGNOSIS — Z3A40 40 weeks gestation of pregnancy: Secondary | ICD-10-CM | POA: Diagnosis not present

## 2020-02-22 DIAGNOSIS — O34211 Maternal care for low transverse scar from previous cesarean delivery: Secondary | ICD-10-CM | POA: Diagnosis not present

## 2020-02-22 LAB — CBC
HCT: 33.1 % — ABNORMAL LOW (ref 36.0–46.0)
Hemoglobin: 10.6 g/dL — ABNORMAL LOW (ref 12.0–15.0)
MCH: 28.2 pg (ref 26.0–34.0)
MCHC: 32 g/dL (ref 30.0–36.0)
MCV: 88 fL (ref 80.0–100.0)
Platelets: 266 10*3/uL (ref 150–400)
RBC: 3.76 MIL/uL — ABNORMAL LOW (ref 3.87–5.11)
RDW: 16.2 % — ABNORMAL HIGH (ref 11.5–15.5)
WBC: 16 10*3/uL — ABNORMAL HIGH (ref 4.0–10.5)
nRBC: 0 % (ref 0.0–0.2)

## 2020-02-22 LAB — RPR: RPR Ser Ql: NONREACTIVE

## 2020-02-22 MED ORDER — BENZOCAINE-MENTHOL 20-0.5 % EX AERO
1.0000 "application " | INHALATION_SPRAY | CUTANEOUS | Status: DC | PRN
  Administered 2020-02-22: 1 via TOPICAL
  Filled 2020-02-22: qty 56

## 2020-02-22 MED ORDER — PRENATAL MULTIVITAMIN CH
1.0000 | ORAL_TABLET | Freq: Every day | ORAL | Status: DC
  Administered 2020-02-22 – 2020-02-23 (×2): 1 via ORAL
  Filled 2020-02-22 (×2): qty 1

## 2020-02-22 MED ORDER — WITCH HAZEL-GLYCERIN EX PADS: 1.0000 "application " | MEDICATED_PAD | CUTANEOUS | Status: DC | PRN

## 2020-02-22 MED ORDER — DIPHENHYDRAMINE HCL 25 MG PO CAPS: 25.0000 mg | ORAL_CAPSULE | Freq: Four times a day (QID) | ORAL | Status: DC | PRN

## 2020-02-22 MED ORDER — ONDANSETRON HCL 4 MG/2ML IJ SOLN
4.0000 mg | INTRAMUSCULAR | Status: DC | PRN
  Filled 2020-02-22: qty 2

## 2020-02-22 MED ORDER — SENNOSIDES-DOCUSATE SODIUM 8.6-50 MG PO TABS: 2.0000 | ORAL_TABLET | Freq: Every day | ORAL | Status: DC

## 2020-02-22 MED ORDER — MAGNESIUM HYDROXIDE 400 MG/5ML PO SUSP: 30.0000 mL | ORAL | Status: DC | PRN

## 2020-02-22 MED ORDER — ACETAMINOPHEN 325 MG PO TABS
650.0000 mg | ORAL_TABLET | ORAL | Status: DC | PRN
  Administered 2020-02-22 – 2020-02-23 (×2): 650 mg via ORAL
  Filled 2020-02-22 (×2): qty 2

## 2020-02-22 MED ORDER — DIBUCAINE (PERIANAL) 1 % EX OINT: 1.0000 "application " | TOPICAL_OINTMENT | CUTANEOUS | Status: DC | PRN

## 2020-02-22 MED ORDER — SIMETHICONE 80 MG PO CHEW: 80.0000 mg | CHEWABLE_TABLET | ORAL | Status: DC | PRN

## 2020-02-22 MED ORDER — ZOLPIDEM TARTRATE 5 MG PO TABS
5.0000 mg | ORAL_TABLET | Freq: Every evening | ORAL | Status: DC | PRN
Start: 2020-02-22 — End: 2020-02-23

## 2020-02-22 MED ORDER — ONDANSETRON HCL 4 MG PO TABS
4.0000 mg | ORAL_TABLET | ORAL | Status: DC | PRN
  Administered 2020-02-22: 05:00:00 4 mg via ORAL
  Filled 2020-02-22: qty 1

## 2020-02-22 MED ORDER — OXYCODONE HCL 5 MG PO TABS: 10.0000 mg | ORAL_TABLET | ORAL | Status: DC | PRN

## 2020-02-22 MED ORDER — OXYCODONE HCL 5 MG PO TABS: 5.0000 mg | ORAL_TABLET | ORAL | Status: DC | PRN

## 2020-02-22 MED ORDER — COCONUT OIL OIL
1.0000 "application " | TOPICAL_OIL | Status: DC | PRN
  Administered 2020-02-22: 1 via TOPICAL

## 2020-02-22 MED ORDER — IBUPROFEN 600 MG PO TABS
600.0000 mg | ORAL_TABLET | Freq: Four times a day (QID) | ORAL | Status: DC
  Administered 2020-02-22 – 2020-02-23 (×4): 600 mg via ORAL
  Filled 2020-02-22 (×5): qty 1

## 2020-02-22 NOTE — Anesthesia Postprocedure Evaluation (Signed)
Anesthesia Post Note  Patient: Kristina Robinson  Procedure(s) Performed: AN AD HOC LABOR EPIDURAL     Anesthesia Type: Epidural Anesthetic complications: no   No complications documented.  Last Vitals:  Vitals:   02/22/20 1038 02/22/20 1502  BP: 114/69 108/72  Pulse: 98 61  Resp: 16 15  Temp: 36.9 C 36.7 C  SpO2:  100%    Last Pain:  Vitals:   02/22/20 1502  TempSrc: Oral  PainSc: 4    Pain Goal:                   [employer]

## 2020-02-22 NOTE — Progress Notes (Signed)
S: Patient comfortable with epidural, notes possible LOF at 0015  O:  FHR 130bpm, moderate variability, + accels, occasional variable decels that resolve with position change. Toco: ctx q 2-3 min  SVE 5/90/-1, AROM blood tinged fluid  A/P 24Y G2P1001 @ [redacted]w[redacted]d, TOLAC 1. TOLAC:continue expectant management - continuous fetal monitoring, currently cat II with occasional variable decels which resolve with position change, overall reassuring 2. GBS positive: penicillin 3. Pain control: epidural  M. Timothy Lasso, MD 02/22/20 1:08 AM

## 2020-02-22 NOTE — Progress Notes (Signed)
Post Partum Day 0 Subjective: Kristina Robinson has no complaints this morning. Ambulating, voiding, tolerating PO. Minimal lochia. Pain controlled.   Objective: Patient Vitals for the past 24 hrs:  BP Temp Temp src Pulse Resp SpO2 Weight  02/22/20 0552 108/69 99.4 F (37.4 C) Oral 96 18 -- --  02/22/20 0450 123/79 98.4 F (36.9 C) Oral 90 18 98 % --  02/22/20 0400 117/64 -- -- 80 17 -- --  02/22/20 0345 112/64 97.6 F (36.4 C) Oral 78 17 -- --  02/22/20 0330 119/76 -- -- 97 18 -- --  02/22/20 0315 126/71 -- -- 92 18 -- --  02/22/20 0300 130/81 -- -- 100 18 -- --  02/22/20 0101 112/79 -- -- 97 19 -- --  02/22/20 0031 109/67 -- -- 80 18 -- --  02/22/20 0000 (!) 101/54 -- -- 75 17 -- --  02/21/20 2333 116/78 -- -- 92 16 -- --  02/21/20 2301 109/75 -- -- 98 18 -- --  02/21/20 2233 100/65 -- -- 93 17 -- --  02/21/20 2201 128/85 -- -- 99 17 -- --  02/21/20 2140 124/81 -- -- (!) 105 17 -- --  02/21/20 2135 121/86 -- -- (!) 109 18 99 % --  02/21/20 2125 117/70 -- -- (!) 115 17 99 % --  02/21/20 2120 99/67 -- -- 98 17 99 % --  02/21/20 2115 -- 98.5 F (36.9 C) Oral -- -- -- --  02/21/20 2110 (!) 132/114 -- -- (!) 108 19 -- --  02/21/20 2100 (!) 124/96 -- -- (!) 117 18 -- --  02/21/20 2016 130/78 98.2 F (36.8 C) -- (!) 104 19 -- 68.3 kg    Physical Exam:  General: alert, cooperative and no distress Lochia: appropriate Uterine Fundus: firm DVT Evaluation: No evidence of DVT seen on physical exam. Negative Homan's sign.  Recent Labs    02/21/20 2033 02/22/20 0328  WBC 13.6* 16.0*  HGB 12.0 10.6*  HCT 36.1 33.1*  PLT 278 266    No results for input(s): NA, K, CL, CO2CT, BUN, CREATININE, GLUCOSE, BILITOT, ALT, AST, ALKPHOS, PROT, ALBUMIN in the last 72 hours.  No results for input(s): CALCIUM, MG, PHOS in the last 72 hours.  No results for input(s): PROTIME, APTT, INR in the last 72 hours.  No results for input(s): PROTIME, APTT, INR, FIBRINOGEN in the last 72  hours. Assessment/Plan:  Kristina Robinson 24 y.o. G2P2002 PPD#0 sp VBAC 1. PPC: continue routine postpartum care 2. Rh pos 3. S/p Tdap prenatally, rubella immune, declined postpartum COVID vaccine  4. Planning for circ to be done as outpatient  5. Dispo: anticipate discharge home tomorrow    LOS: 1 day   [name] 02/22/2020, 7:44 AM

## 2020-02-23 DIAGNOSIS — O34219 Maternal care for unspecified type scar from previous cesarean delivery: Secondary | ICD-10-CM

## 2020-02-23 MED ORDER — ACETAMINOPHEN 325 MG PO TABS: 650.0000 mg | ORAL_TABLET | ORAL | 1 refills | Status: DC | PRN

## 2020-02-23 MED ORDER — IBUPROFEN 600 MG PO TABS: 600.0000 mg | ORAL_TABLET | Freq: Four times a day (QID) | ORAL | 0 refills | Status: DC

## 2020-02-23 MED ORDER — SENNOSIDES-DOCUSATE SODIUM 8.6-50 MG PO TABS: 2.0000 | ORAL_TABLET | Freq: Every day | ORAL | 1 refills | Status: DC

## 2020-02-23 NOTE — Lactation Note (Addendum)
This note was copied from a baby's chart. Lactation Consultation Note  Patient Name: Kristina Robinson [id number] Date: 02/23/2020 Reason for consult: Initial assessment;Term;Mother's request Age:22 hours P2, term female infant. Mom with hx of THC use. Per mom, she has DEBP at home. LC consult was put in at 1839 today. Per mom, she BF her 71 month old for 3 months. Mom requested LC services today due infant not sustaining latch at the breast, she has been using the DEBP and giving infant EBM by curve tip syringe. LC unable assess latch at this time due to mom offering infant  8 mls of EBM by syringe at 2245pm and infant is currently asleep in basinet. Mom will call LC services to assist with latch at next feeding, LC written her name down on white board in mom's room. Mom know to BF infant according to cues, 8 to 12+ times within 24 hours, STS. Mom will continue to work on latching infant at breast and give back volume of EBM, if infant is having difficulties latching at the breast. Mom will continue to use the DEBP every 3 hours for 15 minutes on initial setting, giving back infant any EBM that is expressed. Mom knows to call RN or LC for latch assistance with infant. Mom made aware of O/P services, breastfeeding support groups, community resources, and our phone # for post-discharge questions.    Maternal Data Formula Feeding for Exclusion: No Has patient been taught Hand Expression?: Yes Does the patient have breastfeeding experience prior to this delivery?: Yes  Feeding    LATCH Score Latch:  (reminded mom to call so that i can see latch)                 Interventions Interventions: Breast feeding basics reviewed;Skin to skin;DEBP;Hand express  Lactation Tools Discussed/Used WIC Program: No Pump Education: Setup, frequency, and cleaning;Milk Storage Initiated by:: by RN Date initiated:: 02/22/20   Consult Status Consult Status: Follow-up Date:  02/23/20 Follow-up type: In-patient    Danelle Earthly 02/23/2020, 12:03 AM

## 2020-02-23 NOTE — Discharge Instructions (Signed)

## 2020-02-23 NOTE — Lactation Note (Signed)
This note was copied from a baby's chart. Lactation Consultation Note  Patient Name: Kristina Robinson [id number] Date: 02/23/2020 Reason for consult: Follow-up assessment Age:22 hours   Reviewed engorgement care and monitoring voids/stools. Feed on demand with cues.  Goal 8-12+ times per day after first 24 hrs.  Place baby STS if not cueing.  Mother denies questions or concerns.  She is aware to call OP lactation as needed.   Maternal Data    Feeding Feeding Type: Breast Milk  LATCH Score Latch: Grasps breast easily, tongue down, lips flanged, rhythmical sucking.  Audible Swallowing: Spontaneous and intermittent  Type of Nipple: Everted at rest and after stimulation  Comfort (Breast/Nipple): Soft / non-tender  Hold (Positioning): Assistance needed to correctly position infant at breast and maintain latch.  LATCH Score: 9  Interventions Interventions: Breast feeding basics reviewed  Lactation Tools Discussed/Used     Consult Status Consult Status: Complete Date: 02/23/20 Follow-up type: In-patient    [facility] 02/23/2020, 9:33 AM

## 2020-02-23 NOTE — Clinical Social Work Maternal (Addendum)
CLINICAL SOCIAL WORK MATERNAL/CHILD NOTE  Patient Details  Name: Kristina Robinson MRN: [id number] Date of Birth: Jan 18, 1996  Date:  02/23/2020  Clinical Social Worker Initiating Note:  Hortencia Pilar, LCSW Date/Time: Initiated:  02/23/20/1045     Child's Name:  Kristina Robinson   Biological Parents:  Mother,Father (Kristina Robinson, Kristina Robinson)   Need for Interpreter:  None   Reason for Referral:  Current Substance Use/Substance Use During Pregnancy    Address:  [address] [place] [address]    Phone number:  [phone] (home)     Additional phone number: none    Household Members/Support Persons (HM/SP):   Household Member/Support Person 2,Household Member/Support Person 1   HM/SP Name Relationship DOB or Age  HM/SP -1  Kristina Robinson MOB 11/20/1997  HM/SP -2  Kristina Robinson FOB 07/26/1997  HM/SP -3 Kristina Robinson son 10/18/2018  HM/SP -4 Kristina Robinson  PGF 02/04/1973  HM/SP -5        HM/SP -6        HM/SP -7        HM/SP -8          Natural Supports (not living in the home):      Professional Supports: None   Employment: Unemployed   Type of Work: none   Education:  High school graduate   Homebound arranged:  n/a  Surveyor, quantity Resources:  Medicaid   Other Resources:  Sales executive    Cultural/Religious Considerations Which May Impact Care:  none reported.   Strengths:  Ability to meet basic needs ,Compliance with medical plan ,Home prepared for child ,Pediatrician chosen   Psychotropic Medications:     None reported.     Pediatrician:    [place]  Pediatrician List:   [facility]  [facility]      Pediatrician Fax Number:    Risk Factors/Current Problems:  None   Cognitive State:  Insightful ,Able to Concentrate ,Alert    Mood/Affect:  Relaxed ,Comfortable ,Interested ,Happy    CSW Assessment:  CSW consulted due to MOB reporting THC use in pregnancy. CSW wen to speak with MOB at bedside to address further needs.   CSW congratulated MOB on the birth of infant. CSW advised MOB of CSW's role and the reason for CSW coming to speak with her. MOB expressed that she use THC earlier on in her pregnancy. MOB expressed that she was a smoker before pregnancy and reported that the [facility] use helped with her nausea as "I didn't like taking the medication because it would only make me throw up". CSW understanding and advised MOB of the hospital drug screen policy. CSW advised MOB that infants UDS is negative however CSW would need to monitor infants CDS. CSW reported to MOB that if infants CDS is positive for any medication that was not given or prescribed to MOB then CSW would need to make aCPS report. MOB expressed that she understood and asked MOB about CPS hx. MOB expressed having no current or previous CPS hx. CSW understanding. MOB denies SI, HI and DV to this CSW when asked.   CSW inquired from [facility] on her mental health hx. MOB expressed not having any mental health hx but does report that during this pregnancy she became very anxious "whenm riding in the car". CSW asked MOB what the reason for this was  however MOB not able to identify and a set reason. MOB expressed that she never spoke to anyone regarding this, but does report that she plans to follow up with [facility] Provider about it. CSW encouraged MOB to do so. MOB indicated to CSW that she has never been on medication or in therapy for any mental health reasons. CSW provided MOB with therapy resources in the event that MOB feels that she needs them. MOB expressed that she as all needed items to care for infant with plans for infant to sleep in crib once arrived home. MOB reported that her support is her mom.   CSW took time to provide MOB with PPD and SIDS education. MOB was given PPD Checklist in order to keep track of feelings as they may relate to PPD. MOB  thanked CSW and expressed no other needs.   CSW will continue to monitor infants CDS and make CPS report if warranted.    CSW Plan/Description:  No Further Intervention Required/No Barriers to Discharge,Sudden Infant Death Syndrome (SIDS) Education,Perinatal Mood and Anxiety Disorder (PMADs) Education,CSW Will Continue to Monitor Umbilical Cord Tissue Drug Screen Results and Make Report if [facility] Drug Screen Policy Information    Robb Matar, LCSWA 02/23/2020, 11:03 AM

## 2020-02-23 NOTE — Lactation Note (Signed)
This note was copied from a baby's chart. Lactation Consultation Note  Patient Name: Kristina Robinson [id number] Date: 02/23/2020 Reason for consult: Follow-up assessment;Mother's request Age:22 hours P2, term female infant. Mom requesting assistance with latching infant at the breast. LC asked mom to wait until infant's mouth is wide, bring him chin first to breast, mom latched infant on her right breast using the football hold position, infant latched with depth and was still BF after 10 minutes when LC left the room. LC encouraged mom to do breast stimulation keep infant BF longer by: doing breast compression, talking to infant gently stroking infant's neck and shoulder. Mom is pumping and hand expressing and giving infant extra volume back after latching infant at the breast with curve tip syringe. Mom knows to BF infant according to cues, 8 to 12+ times within 24 hours, STS. Mom knows to call RN or LC if she has any questions, concerns or needs further assistance with latching infant at the breast.  Maternal Data    Feeding    LATCH Score Latch: Grasps breast easily, tongue down, lips flanged, rhythmical sucking.  Audible Swallowing: Spontaneous and intermittent  Type of Nipple: Everted at rest and after stimulation  Comfort (Breast/Nipple): Soft / non-tender  Hold (Positioning): Assistance needed to correctly position infant at breast and maintain latch.  LATCH Score: 9  Interventions Interventions: Breast massage;Breast compression;Adjust position;Skin to skin;Assisted with latch;Support pillows;Position options  Lactation Tools Discussed/Used     Consult Status Consult Status: Follow-up Date: 02/23/20 Follow-up type: In-patient    Danelle Earthly 02/23/2020, 6:52 AM

## 2020-02-23 NOTE — Discharge Summary (Signed)
Postpartum Discharge Summary    Patient Name: Kristina Robinson DOB: 02-25-1996 MRN: [id number]  Date of admission: 02/21/2020 Delivery date:02/22/2020  Delivering provider: Irene Pap E  Date of discharge: 02/23/2020  Admitting diagnosis: Uterine contractions [O47.9] Intrauterine pregnancy: [redacted]w[redacted]d    Secondary diagnosis:  Active Problems:   VBAC (vaginal birth after Cesarean)  Additional problems: anemia of pregnancy    Discharge diagnosis: VBAC                                              Post partum procedures:none Augmentation: AROM Complications: None  Hospital course: Onset of Labor With Vaginal Delivery      24y.o. yo [id number] admitted in Active Labor on 02/21/2020. Patient had an uncomplicated labor course as follows:  Membrane Rupture Time/Date: 1:00 AM ,02/22/2020   Delivery Method:VBAC, Spontaneous  Episiotomy: None  Lacerations:  Periurethral  Patient had an uncomplicated postpartum course.  She is ambulating, tolerating a regular diet, passing flatus, and urinating well. Patient is discharged home in stable condition on 02/23/20.  Newborn Data: Birth date:02/22/2020  Birth time:2:35 AM  Gender:Female  Living status:Living  Apgars:8 ,9  Weight:3980 g   Magnesium Sulfate received: No BMZ received: No Rhophylac:N/A MMR:N/A T-DaP:Given prenatally FlFXO:VANVBrenatally Transfusion:No  Physical exam  Vitals:   02/22/20 1502 02/22/20 1802 02/22/20 2048 02/23/20 0538  BP: 108/72 105/62 (!) 101/59 107/65  Pulse: 61 64 60 (!) 56  Resp: '15 20 16   ' Temp: 98 F (36.7 C) 98.2 F (36.8 C) 97.8 F (36.6 C) 97.9 F (36.6 C)  TempSrc: Oral Oral Oral Oral  SpO2: 100%  99%   Weight:       General: alert and no distress Lochia: appropriate Uterine Fundus: firm DVT Evaluation: No evidence of DVT seen on physical exam.  Labs: Lab Results  Component Value Date   WBC 16.0 (H) 02/22/2020   HGB 10.6 (L) 02/22/2020   HCT 33.1 (L)  02/22/2020   MCV 88.0 02/22/2020   PLT 266 02/22/2020   CMP Latest Ref Rng & Units 01/11/2020  Glucose 70 - 99 mg/dL 96  BUN 6 - 20 mg/dL 6  Creatinine 0.44 - 1.00 mg/dL 0.53  Sodium 135 - 145 mmol/L 135  Potassium 3.5 - 5.1 mmol/L 3.6  Chloride 98 - 111 mmol/L 103  CO2 22 - 32 mmol/L 24  Calcium 8.9 - 10.3 mg/dL 8.8(L)  Total Protein 6.5 - 8.1 g/dL 5.7(L)  Total Bilirubin 0.3 - 1.2 mg/dL 0.5  Alkaline Phos 38 - 126 U/L 73  AST 15 - 41 U/L 16  ALT 0 - 44 U/L 13   Edinburgh Score: Edinburgh Postnatal Depression Scale Screening Tool 02/23/2020  I have been able to laugh and see the funny side of things. 0  I have looked forward with enjoyment to things. 0  I have blamed myself unnecessarily when things went wrong. 0  I have been anxious or worried for no good reason. 0  I have felt scared or panicky for no good reason. 0  Things have been getting on top of me. 0  I have been so unhappy that I have had difficulty sleeping. 0  I have felt sad or miserable. 0  I have been so unhappy that I have been crying. 0  The thought of harming myself has occurred to me.  0  Edinburgh Postnatal Depression Scale Total 0      After visit meds:  Allergies as of 02/23/2020   No Known Allergies     Medication List    TAKE these medications   acetaminophen 325 MG tablet Commonly known as: Tylenol Take 2 tablets (650 mg total) by mouth every 4 (four) hours as needed (for pain scale < 4).   ibuprofen 600 MG tablet Commonly known as: ADVIL Take 1 tablet (600 mg total) by mouth every 6 (six) hours.   PrePLUS 27-1 MG Tabs Take 1 tablet by mouth daily.   senna-docusate 8.6-50 MG tablet Commonly known as: Senokot-S Take 2 tablets by mouth daily.        Discharge home in stable condition Infant Feeding: Breast Infant Disposition:home with mother Discharge instruction: per After Visit Summary and Postpartum booklet. Activity: Advance as tolerated. Pelvic rest for 6 weeks.  Diet:  routine diet Anticipated Birth Control: Unsure Postpartum Appointment:4 weeks Additional Postpartum F/U: none Future Appointments:No future appointments. Follow up Visit:  Follow-up Information    Rowland Lathe, MD. Schedule an appointment as soon as possible for a visit in 4 week(s).   Specialty: Obstetrics and Gynecology Contact information: [address] [place] [address] [phone]                   02/23/2020 Jonelle Sidle, MD

## 2020-02-23 NOTE — Progress Notes (Signed)
Post Partum Day 1 Subjective: no complaints, up ad lib, voiding, and tolerating PO  Objective: Patient Vitals for the past 24 hrs:  BP Temp Temp src Pulse Resp SpO2  02/23/20 0538 107/65 97.9 F (36.6 C) Oral (!) 56 -- --  02/22/20 2048 (!) 101/59 97.8 F (36.6 C) Oral 60 16 99 %  02/22/20 1802 105/62 98.2 F (36.8 C) Oral 64 20 --  02/22/20 1502 108/72 98 F (36.7 C) Oral 61 15 100 %  02/22/20 1038 114/69 98.4 F (36.9 C) Oral 98 16 --    Physical Exam:  General: alert and no distress Lochia: appropriate Uterine Fundus: firm DVT Evaluation: No evidence of DVT seen on physical exam.  Recent Labs    02/21/20 2033 02/22/20 0328  WBC 13.6* 16.0*  HGB 12.0 10.6*  HCT 36.1 33.1*  PLT 278 266   Assessment/Plan: Discharge home  Kristina Robinson 24 y.o. [id number] PPD#1 sp VBAC at [redacted]w[redacted]d  1. PPC: doing well, meeting goals 2. Vaccines: S/p Tdap, flu prenatally, rubella immune, declined postpartum COVID vaccine 3. Circ: Desires neonatal circumcision, R/B/A of procedure discussed at length. Pt understands that neonatal circumcision is not considered medically necessary and is elective. The risks include, but are not limited to bleeding, infection, damage to the penis, development of scar tissue, and having to have it redone at a later date. Pt understands theses risks and wishes to proceed A POS, breastfeeding, baby boy in room   LOS: 2 days   Kristina Robinson 02/23/2020, 8:37 AM

## 2020-03-23 ENCOUNTER — Ambulatory Visit: Payer: Self-pay

## 2020-03-23 NOTE — Lactation Note (Addendum)
This note was copied from a baby's chart. Lactation Consultation Note  Patient Name: Kristina Robinson Date: 03/23/2020 Reason for consult: Initial assessment;MD order Age:23 wk.o.  Infant fussy on arrival.  Mom giving him pacifier. Mom reports she has tried to do some bottle lately, especially on the right breast because she does want his eye to rub. Mom reports he also does not feed as will on the right breast that that nipple is dimpled.  Mom reports she really wants to breastfeed him and feels guilty when she gives him bottle.   Mom reports  She has a one and a half year old at home too.  Discussed feeding more often on cue could possibly help him feed better. Discussed how breastfeeding hard in the beginning.  Discussed usually by 6-8 weeks breastfeeding starts to get easier.   Urged mom not to have guilt regarding bottle feeding him occasionally with bottles especially while sick that he is still getting breastmilk. Explained to mom that while he is sick he is going to the breast for comfort and medicine as well as food.   Asked mom if she would like me to observe a breastfeeding on right breast and see if we could get his face not touching.  Mom agreed.   Assisted mom in getting in Koala hold on right.  Infant immediately latched and mom reported comfort.  After close to 10 minutes he starting pulling off and on some and started some noisy breathing.   Mom repositioned him in more laid back and cross cradle.  Mom started some massage and compression.  Coming off and on slowed however he was still having noisy breathing.  Urged mom to call lactation as needed  And follow up with lactation as planned.    nt want his face touching.  Maternal Data Formula Feeding for Exclusion: No  Feeding Feeding Type: Breast Fed  LATCH Score Latch: Grasps breast easily, tongue down, lips flanged, rhythmical sucking.  Audible Swallowing: Spontaneous and intermittent (infant feeding loudly off   and on/stridor?/started later in feed)  Type of Nipple: Everted at rest and after stimulation  Comfort (Breast/Nipple): Soft / non-tender  Hold (Positioning): Assistance needed to correctly position infant at breast and maintain latch.  LATCH Score: 9  Interventions Interventions: Breast feeding basics reviewed;Assisted with latch;Skin to skin;Breast massage;Pre-pump if needed;Adjust position;Support pillows;DEBP;Hand pump  Lactation Tools Discussed/Used Tools: Pump   Consult Status Consult Status: PRN    Neomia Dear 03/23/2020, 4:09 PM

## 2020-04-29 DIAGNOSIS — Z3042 Encounter for surveillance of injectable contraceptive: Secondary | ICD-10-CM | POA: Diagnosis not present

## 2020-04-29 DIAGNOSIS — Z3202 Encounter for pregnancy test, result negative: Secondary | ICD-10-CM | POA: Diagnosis not present

## 2021-02-02 IMAGING — US US OB TRANSVAGINAL
1 series · 15 of 28 positions shown · non-contrast
Comparison: None.

CLINICAL DATA: Vaginal bleeding, dating

EXAM:
OBSTETRIC <14 WK ULTRASOUND
TECHNIQUE: Transabdominal ultrasound was performed for evaluation of the
gestation as well as the maternal uterus and adnexal regions.

[Series 1: us ob transvaginal · 42 acquisitions, 15 frames shown]
[im 1/42]
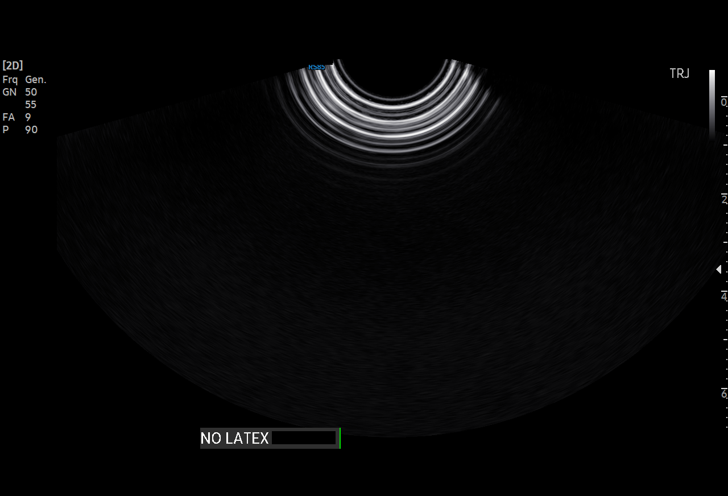
[im 4/42]
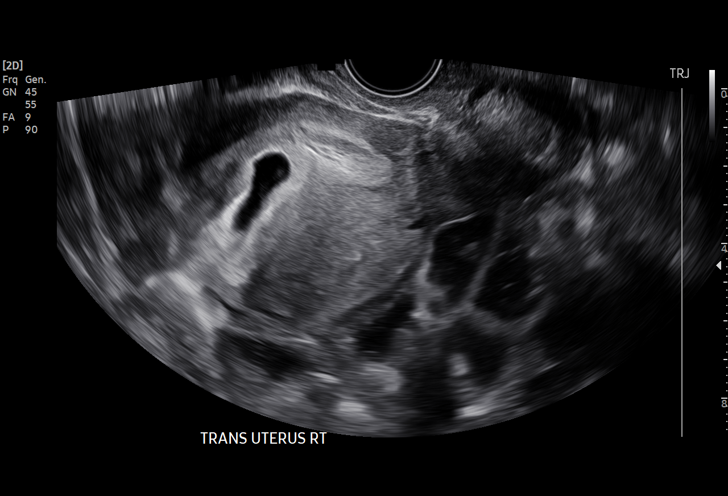
[im 7/42]
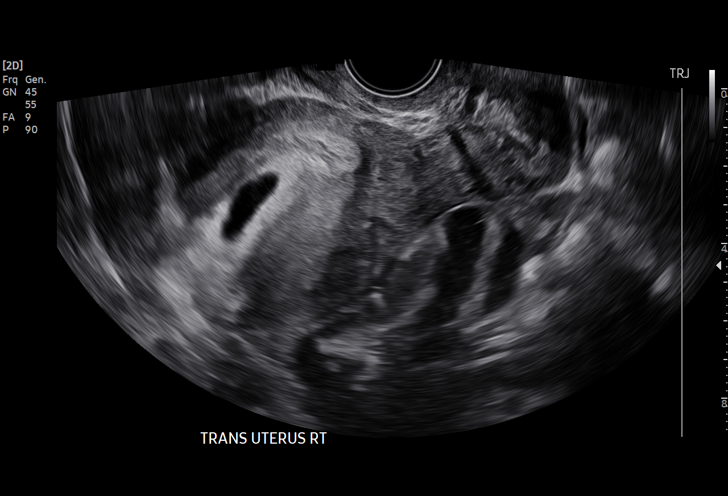
[im 10/42]
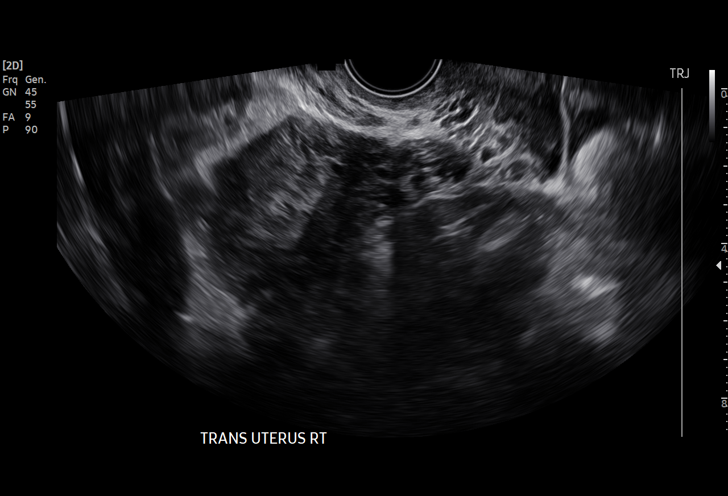
[im 13/42]
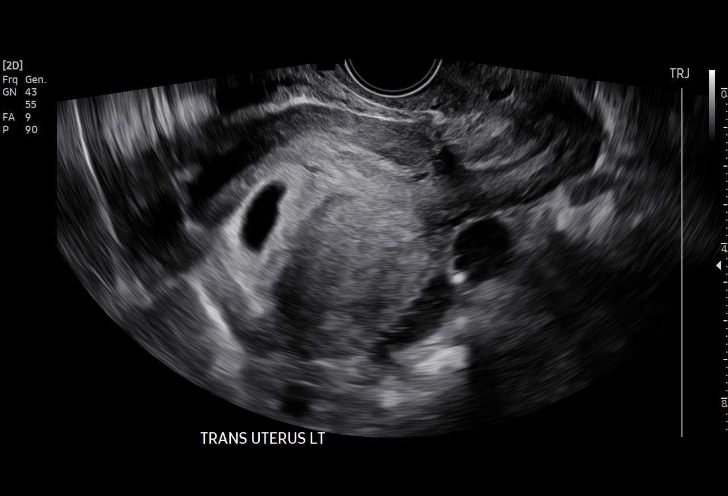
[im 16/42]
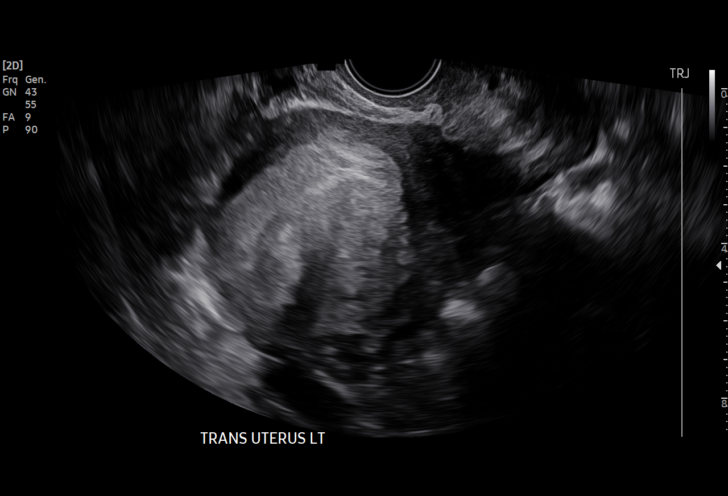
[im 19/42]
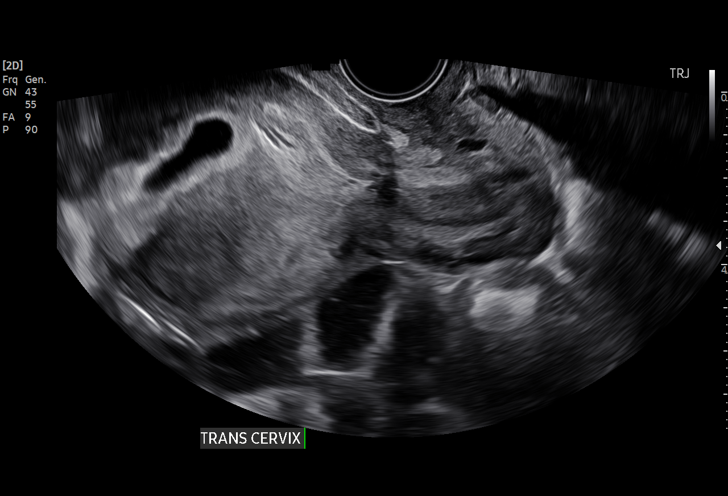
[im 22/42]
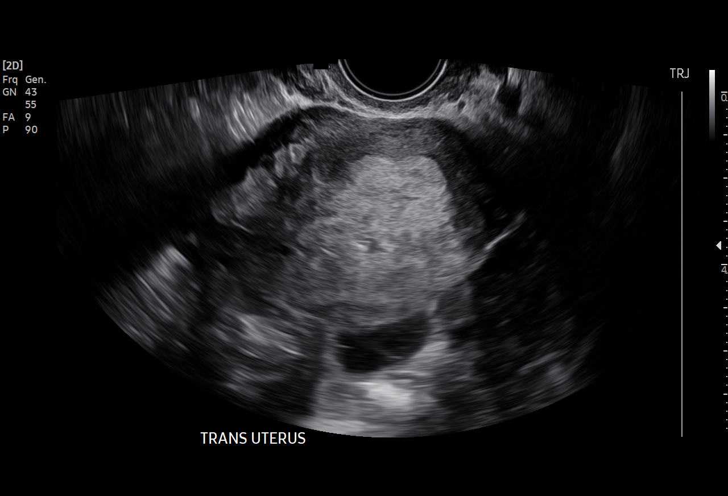
[im 23/42]
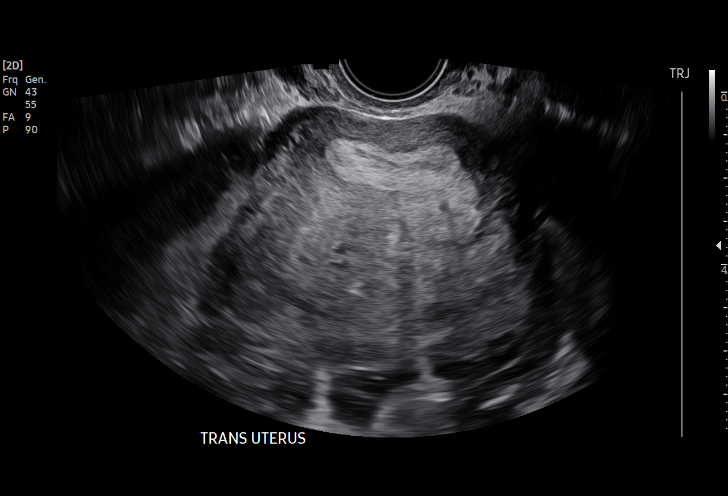
[im 26/42]
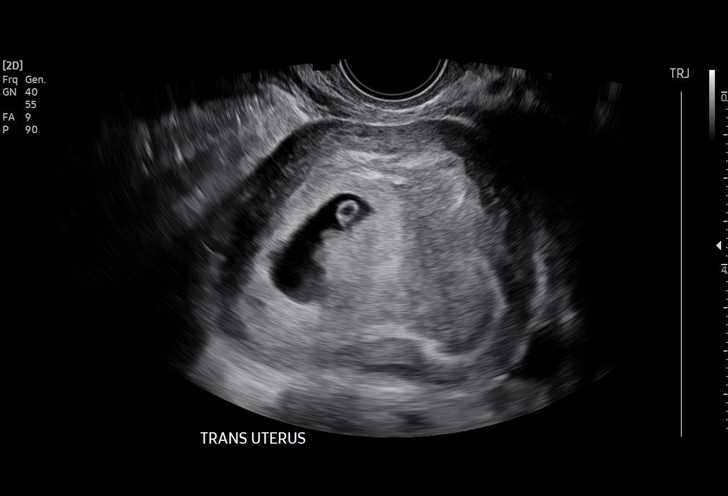
[im 29/42]
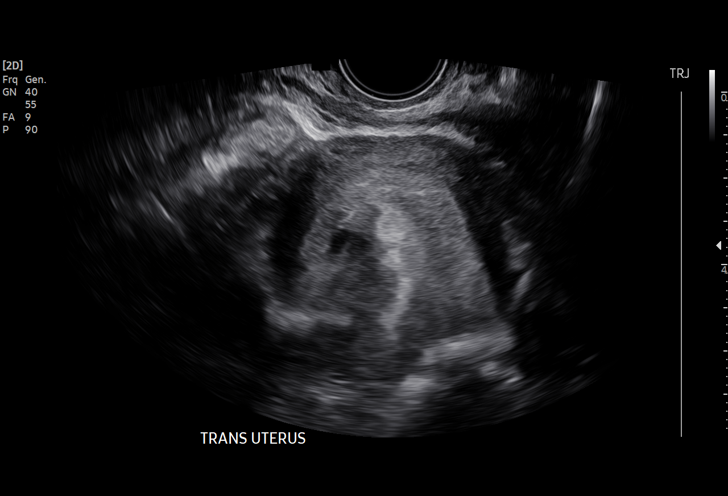
[im 32/42]
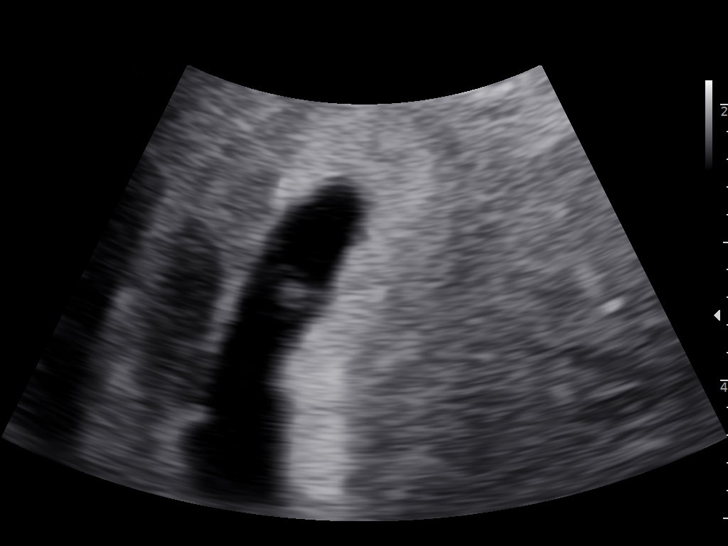
[im 35/42]
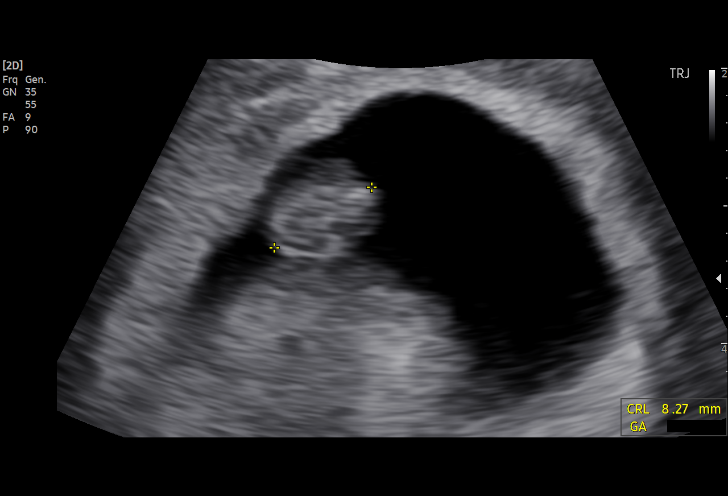
[im 38/42]
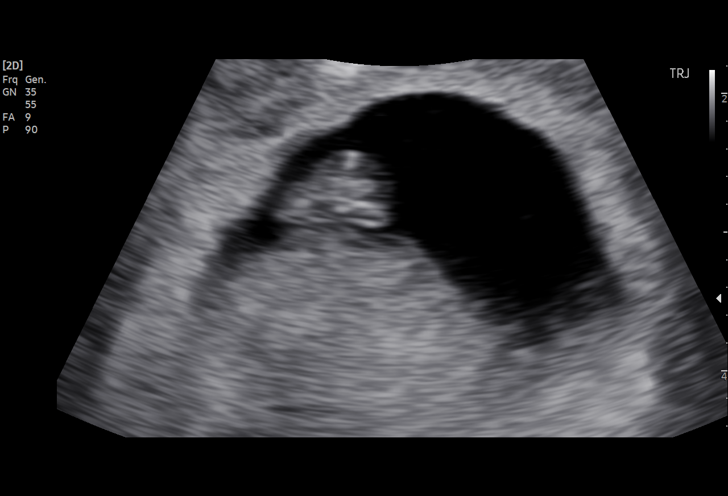
[im 42/42]
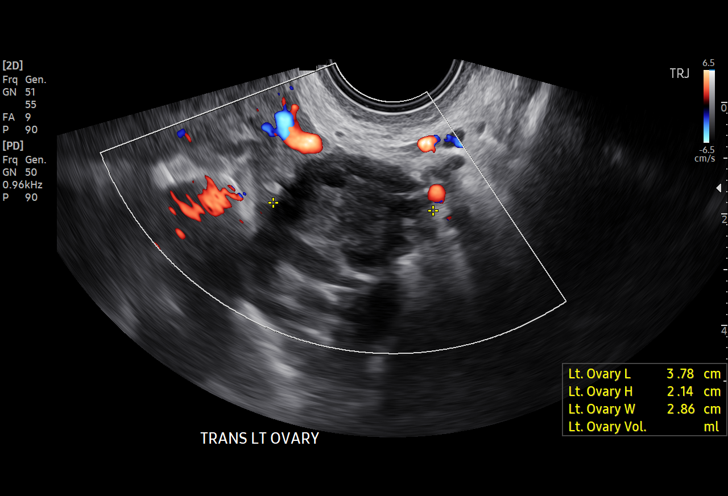

[15 of 28 positions shown; findings below may reference images not displayed]

FINDINGS: Intrauterine gestational sac: Single

Yolk sac:  Visualized.

Embryo:  Visualized.

Cardiac Activity: Visualized.

Heart Rate: 123 bpm

CRL:   8.2 mm   6 w 5 d                  US EDC: 02/21/2020

Subchorionic hemorrhage:  None visualized.

Maternal uterus/adnexae: Corpus luteum noted in the right ovary.
Otherwise normal.
IMPRESSION: Single intrauterine gestation at sonographic gestational age of 6
weeks, 5 days. Fetal heart rate 123 bpm. EDD 02/21/2020.

## 2021-08-28 DIAGNOSIS — M79601 Pain in right arm: Secondary | ICD-10-CM | POA: Diagnosis not present

## 2021-08-28 DIAGNOSIS — M7989 Other specified soft tissue disorders: Secondary | ICD-10-CM | POA: Diagnosis not present

## 2021-08-28 DIAGNOSIS — S6991XA Unspecified injury of right wrist, hand and finger(s), initial encounter: Secondary | ICD-10-CM | POA: Diagnosis not present

## 2021-08-28 DIAGNOSIS — R21 Rash and other nonspecific skin eruption: Secondary | ICD-10-CM | POA: Diagnosis not present

## 2021-08-28 DIAGNOSIS — M79631 Pain in right forearm: Secondary | ICD-10-CM | POA: Diagnosis not present

## 2021-08-28 DIAGNOSIS — S52201A Unspecified fracture of shaft of right ulna, initial encounter for closed fracture: Secondary | ICD-10-CM | POA: Diagnosis not present

## 2021-08-28 DIAGNOSIS — S52221A Displaced transverse fracture of shaft of right ulna, initial encounter for closed fracture: Secondary | ICD-10-CM | POA: Diagnosis not present

## 2021-09-01 DIAGNOSIS — S52291A Other fracture of shaft of right ulna, initial encounter for closed fracture: Secondary | ICD-10-CM | POA: Diagnosis not present

## 2021-09-08 DIAGNOSIS — S52221A Displaced transverse fracture of shaft of right ulna, initial encounter for closed fracture: Secondary | ICD-10-CM | POA: Diagnosis not present

## 2021-09-08 DIAGNOSIS — M79601 Pain in right arm: Secondary | ICD-10-CM | POA: Diagnosis not present

## 2021-09-08 DIAGNOSIS — S52231D Displaced oblique fracture of shaft of right ulna, subsequent encounter for closed fracture with routine healing: Secondary | ICD-10-CM | POA: Diagnosis not present

## 2021-09-08 DIAGNOSIS — S52201A Unspecified fracture of shaft of right ulna, initial encounter for closed fracture: Secondary | ICD-10-CM | POA: Diagnosis not present

## 2021-09-16 DIAGNOSIS — R140 Abdominal distension (gaseous): Secondary | ICD-10-CM | POA: Diagnosis not present

## 2021-09-16 DIAGNOSIS — R197 Diarrhea, unspecified: Secondary | ICD-10-CM | POA: Diagnosis not present

## 2021-09-16 DIAGNOSIS — J3489 Other specified disorders of nose and nasal sinuses: Secondary | ICD-10-CM | POA: Diagnosis not present

## 2021-09-16 DIAGNOSIS — R1084 Generalized abdominal pain: Secondary | ICD-10-CM | POA: Diagnosis not present

## 2021-09-16 DIAGNOSIS — R1110 Vomiting, unspecified: Secondary | ICD-10-CM | POA: Diagnosis not present

## 2021-09-16 DIAGNOSIS — I10 Essential (primary) hypertension: Secondary | ICD-10-CM | POA: Diagnosis not present

## 2021-09-16 DIAGNOSIS — R0981 Nasal congestion: Secondary | ICD-10-CM | POA: Diagnosis not present

## 2021-09-16 DIAGNOSIS — R059 Cough, unspecified: Secondary | ICD-10-CM | POA: Diagnosis not present

## 2021-09-16 DIAGNOSIS — R1013 Epigastric pain: Secondary | ICD-10-CM | POA: Diagnosis not present

## 2021-09-19 DIAGNOSIS — R1110 Vomiting, unspecified: Secondary | ICD-10-CM | POA: Diagnosis not present

## 2021-09-19 DIAGNOSIS — R197 Diarrhea, unspecified: Secondary | ICD-10-CM | POA: Diagnosis not present

## 2021-10-06 DIAGNOSIS — S52221D Displaced transverse fracture of shaft of right ulna, subsequent encounter for closed fracture with routine healing: Secondary | ICD-10-CM | POA: Diagnosis not present

## 2021-10-06 DIAGNOSIS — S52201D Unspecified fracture of shaft of right ulna, subsequent encounter for closed fracture with routine healing: Secondary | ICD-10-CM | POA: Diagnosis not present

## 2022-02-13 LAB — OB RESULTS CONSOLE ABO/RH: RH Type: POSITIVE

## 2022-02-13 LAB — OB RESULTS CONSOLE ANTIBODY SCREEN: Antibody Screen: NEGATIVE

## 2022-02-13 LAB — HEPATITIS C ANTIBODY: HCV Ab: NEGATIVE

## 2022-02-13 LAB — OB RESULTS CONSOLE RPR: RPR: NONREACTIVE

## 2022-02-13 LAB — OB RESULTS CONSOLE HIV ANTIBODY (ROUTINE TESTING): HIV: NONREACTIVE

## 2022-02-13 LAB — OB RESULTS CONSOLE HEPATITIS B SURFACE ANTIGEN: Hepatitis B Surface Ag: NEGATIVE

## 2022-02-13 LAB — OB RESULTS CONSOLE RUBELLA ANTIBODY, IGM: Rubella: IMMUNE

## 2022-03-14 LAB — OB RESULTS CONSOLE GC/CHLAMYDIA
Chlamydia: NEGATIVE
Neisseria Gonorrhea: NEGATIVE

## 2022-04-12 ENCOUNTER — Other Ambulatory Visit: Payer: Self-pay | Admitting: Family Medicine

## 2022-04-12 DIAGNOSIS — O30042 Twin pregnancy, dichorionic/diamniotic, second trimester: Secondary | ICD-10-CM

## 2022-05-30 ENCOUNTER — Other Ambulatory Visit: Payer: Self-pay | Admitting: *Deleted

## 2022-05-30 ENCOUNTER — Ambulatory Visit: Payer: Medicaid Other | Admitting: *Deleted

## 2022-05-30 ENCOUNTER — Ambulatory Visit: Payer: Medicaid Other | Attending: Obstetrics and Gynecology

## 2022-05-30 VITALS — BP 104/60 | HR 82

## 2022-05-30 DIAGNOSIS — Z3A20 20 weeks gestation of pregnancy: Secondary | ICD-10-CM

## 2022-05-30 DIAGNOSIS — O30042 Twin pregnancy, dichorionic/diamniotic, second trimester: Secondary | ICD-10-CM | POA: Insufficient documentation

## 2022-05-30 DIAGNOSIS — O34219 Maternal care for unspecified type scar from previous cesarean delivery: Secondary | ICD-10-CM

## 2022-05-30 DIAGNOSIS — Z362 Encounter for other antenatal screening follow-up: Secondary | ICD-10-CM

## 2022-06-23 DIAGNOSIS — O30049 Twin pregnancy, dichorionic/diamniotic, unspecified trimester: Secondary | ICD-10-CM | POA: Insufficient documentation

## 2022-06-27 ENCOUNTER — Ambulatory Visit: Payer: Medicaid Other | Attending: Maternal & Fetal Medicine

## 2022-06-27 ENCOUNTER — Ambulatory Visit: Payer: Medicaid Other | Admitting: *Deleted

## 2022-06-27 VITALS — BP 108/63 | HR 85

## 2022-06-27 DIAGNOSIS — O34219 Maternal care for unspecified type scar from previous cesarean delivery: Secondary | ICD-10-CM | POA: Diagnosis not present

## 2022-06-27 DIAGNOSIS — O30042 Twin pregnancy, dichorionic/diamniotic, second trimester: Secondary | ICD-10-CM | POA: Diagnosis present

## 2022-06-27 DIAGNOSIS — Z362 Encounter for other antenatal screening follow-up: Secondary | ICD-10-CM | POA: Insufficient documentation

## 2022-06-27 DIAGNOSIS — Z3A24 24 weeks gestation of pregnancy: Secondary | ICD-10-CM

## 2022-07-25 ENCOUNTER — Other Ambulatory Visit: Payer: Self-pay | Admitting: *Deleted

## 2022-07-25 ENCOUNTER — Ambulatory Visit: Payer: Medicaid Other | Attending: Maternal & Fetal Medicine | Admitting: *Deleted

## 2022-07-25 ENCOUNTER — Ambulatory Visit (HOSPITAL_BASED_OUTPATIENT_CLINIC_OR_DEPARTMENT_OTHER): Payer: Medicaid Other

## 2022-07-25 VITALS — BP 119/64 | HR 103

## 2022-07-25 DIAGNOSIS — O34219 Maternal care for unspecified type scar from previous cesarean delivery: Secondary | ICD-10-CM | POA: Diagnosis not present

## 2022-07-25 DIAGNOSIS — Z3A28 28 weeks gestation of pregnancy: Secondary | ICD-10-CM | POA: Insufficient documentation

## 2022-07-25 DIAGNOSIS — O321XX0 Maternal care for breech presentation, not applicable or unspecified: Secondary | ICD-10-CM | POA: Diagnosis not present

## 2022-07-25 DIAGNOSIS — O30043 Twin pregnancy, dichorionic/diamniotic, third trimester: Secondary | ICD-10-CM

## 2022-07-25 DIAGNOSIS — Z362 Encounter for other antenatal screening follow-up: Secondary | ICD-10-CM | POA: Diagnosis not present

## 2022-07-25 DIAGNOSIS — O30042 Twin pregnancy, dichorionic/diamniotic, second trimester: Secondary | ICD-10-CM

## 2022-08-08 ENCOUNTER — Non-Acute Institutional Stay (HOSPITAL_COMMUNITY)
Admission: RE | Admit: 2022-08-08 | Discharge: 2022-08-08 | Disposition: A | Discharge status: Home / Self Care | Payer: Medicaid Other | Source: Ambulatory Visit | Attending: Internal Medicine | Admitting: Internal Medicine

## 2022-08-08 DIAGNOSIS — O99019 Anemia complicating pregnancy, unspecified trimester: Secondary | ICD-10-CM | POA: Insufficient documentation

## 2022-08-08 MED ORDER — SODIUM CHLORIDE 0.9 % IV SOLN
510.0000 mg | Freq: Once | INTRAVENOUS | Status: AC
  Administered 2022-08-08: 510 mg via INTRAVENOUS
  Filled 2022-08-08: qty 17

## 2022-08-08 MED ORDER — SODIUM CHLORIDE 0.9 % IV SOLN: INTRAVENOUS | Status: DC | PRN

## 2022-08-08 NOTE — Progress Notes (Signed)
PATIENT CARE CENTER NOTE  Diagnosis: ICD-10:099.019: Anemia complicating pregnancy, unspecified trimester   Provider: Waldon Reining, MD  Procedure: IV Feraheme 510 mg (dose #1 of 4)  Note: Patient received IV Feraheme 510 mg via PIV. Patient tolerated infusion with no adverse reaction. Pt observed for 30 minutes post infusion. AVS printed and given to pt. Pt advised that she will receive dose every 7 days for a total of 4 doses, pt verbalized understanding. Next appointment is scheduled for one week. Pt is alert, oriented, and ambulatory at discharge.

## 2022-08-15 ENCOUNTER — Non-Acute Institutional Stay (HOSPITAL_COMMUNITY)
Admission: RE | Admit: 2022-08-15 | Discharge: 2022-08-15 | Disposition: A | Discharge status: Home / Self Care | Payer: Medicaid Other | Source: Ambulatory Visit | Attending: Internal Medicine | Admitting: Internal Medicine

## 2022-08-15 DIAGNOSIS — O99019 Anemia complicating pregnancy, unspecified trimester: Secondary | ICD-10-CM | POA: Insufficient documentation

## 2022-08-15 MED ORDER — SODIUM CHLORIDE 0.9 % IV SOLN
510.0000 mg | Freq: Once | INTRAVENOUS | Status: AC
  Administered 2022-08-15: 510 mg via INTRAVENOUS
  Filled 2022-08-15: qty 17

## 2022-08-15 MED ORDER — SODIUM CHLORIDE 0.9 % IV SOLN: INTRAVENOUS | Status: DC | PRN

## 2022-08-15 NOTE — Progress Notes (Signed)
PATIENT CARE CENTER NOTE   Diagnosis: Anemia complicating pregnancy, unspecified trimester   Provider: Waynard Reeds, MD   Procedure: Feraheme infusion (dose # 2 of 4)   Note: Patient received Feraheme infusion via PIV. Patient tolerated infusion well with no adverse reaction. Observed patient for 30 minutes post infusion. Vital signs stable. Printed AVS offered but patient refused. Patient to return in 1 week for next infusion and will schedule next appointment at the front desk. Patient alert, oriented and ambulatory at discharge.

## 2022-08-22 ENCOUNTER — Telehealth: Payer: Self-pay | Admitting: Family Medicine

## 2022-08-22 ENCOUNTER — Ambulatory Visit: Payer: Medicaid Other | Admitting: *Deleted

## 2022-08-22 ENCOUNTER — Non-Acute Institutional Stay (HOSPITAL_COMMUNITY)
Admission: RE | Admit: 2022-08-22 | Discharge: 2022-08-22 | Disposition: A | Discharge status: Home / Self Care | Payer: Medicaid Other | Source: Ambulatory Visit | Attending: Internal Medicine | Admitting: Internal Medicine

## 2022-08-22 ENCOUNTER — Ambulatory Visit: Payer: Medicaid Other | Attending: Maternal & Fetal Medicine

## 2022-08-22 VITALS — BP 116/64 | HR 95

## 2022-08-22 DIAGNOSIS — Z3A00 Weeks of gestation of pregnancy not specified: Secondary | ICD-10-CM | POA: Diagnosis not present

## 2022-08-22 DIAGNOSIS — O30043 Twin pregnancy, dichorionic/diamniotic, third trimester: Secondary | ICD-10-CM

## 2022-08-22 DIAGNOSIS — O99019 Anemia complicating pregnancy, unspecified trimester: Secondary | ICD-10-CM | POA: Insufficient documentation

## 2022-08-22 DIAGNOSIS — Z3A32 32 weeks gestation of pregnancy: Secondary | ICD-10-CM | POA: Diagnosis not present

## 2022-08-22 DIAGNOSIS — Z362 Encounter for other antenatal screening follow-up: Secondary | ICD-10-CM | POA: Diagnosis present

## 2022-08-22 DIAGNOSIS — O30042 Twin pregnancy, dichorionic/diamniotic, second trimester: Secondary | ICD-10-CM | POA: Diagnosis present

## 2022-08-22 DIAGNOSIS — O34219 Maternal care for unspecified type scar from previous cesarean delivery: Secondary | ICD-10-CM

## 2022-08-22 MED ORDER — SODIUM CHLORIDE 0.9 % IV SOLN
510.0000 mg | Freq: Once | INTRAVENOUS | Status: AC
  Administered 2022-08-22: 510 mg via INTRAVENOUS
  Filled 2022-08-22: qty 17

## 2022-08-22 MED ORDER — SODIUM CHLORIDE 0.9 % IV SOLN: INTRAVENOUS | Status: DC | PRN

## 2022-08-22 NOTE — Telephone Encounter (Signed)
Error

## 2022-08-22 NOTE — Progress Notes (Signed)
PATIENT CARE CENTER NOTE:  Diagnosis: Anemia complicating pregnancy , unspecified trimester  Provider: Waynard Reeds MD  Procedure: Feraheme infusion 510mg    Patient received IV Feraheme ( dose #3 of 4). No premeds were required per orders. Observed for at least 30 minutes post infusion. Tolerated well no adverse reaction noted,  vitals stable. Pt instructed to schedule last infusion in 1 week at front desk prior to leaving, verbalized understanding. Pt declined AVS. Patient alert, oriented, and ambulatory at the time of discharge.

## 2022-08-28 ENCOUNTER — Ambulatory Visit: Payer: Medicaid Other

## 2022-08-29 ENCOUNTER — Non-Acute Institutional Stay (HOSPITAL_COMMUNITY)
Admission: RE | Admit: 2022-08-29 | Discharge: 2022-08-29 | Disposition: A | Discharge status: Home / Self Care | Payer: Medicaid Other | Source: Ambulatory Visit | Attending: Internal Medicine | Admitting: Internal Medicine

## 2022-08-29 DIAGNOSIS — O99019 Anemia complicating pregnancy, unspecified trimester: Secondary | ICD-10-CM | POA: Insufficient documentation

## 2022-08-29 MED ORDER — SODIUM CHLORIDE 0.9 % IV SOLN: INTRAVENOUS | Status: DC | PRN

## 2022-08-29 MED ORDER — SODIUM CHLORIDE 0.9 % IV SOLN
510.0000 mg | Freq: Once | INTRAVENOUS | Status: AC
  Administered 2022-08-29: 510 mg via INTRAVENOUS
  Filled 2022-08-29: qty 17

## 2022-08-29 NOTE — Progress Notes (Signed)
PATIENT CARE CENTER NOTE  Diagnosis: Anemia complicating pregnancy, unspecified trimester    Provider: Waynard Reeds MD  Procedure: Feraheme infusion (dose #4 of 4)  Note: Patient received Feraheme 510 mg via PIV. Patient tolerated infusion with no adverse reaction. Pt observed for 30 minutes post infusion, vital signs stable. Pt is alert, oriented, and ambulatory at discharge.

## 2022-08-30 ENCOUNTER — Encounter (HOSPITAL_COMMUNITY): Payer: Medicaid Other

## 2022-08-30 ENCOUNTER — Inpatient Hospital Stay (HOSPITAL_COMMUNITY)
Admission: RE | Admit: 2022-08-30 | Discharge: 2022-08-30 | Disposition: A | Discharge status: Home / Self Care | Payer: Medicaid Other | Source: Ambulatory Visit

## 2022-09-04 ENCOUNTER — Ambulatory Visit: Payer: Medicaid Other

## 2022-09-11 ENCOUNTER — Ambulatory Visit: Payer: Medicaid Other

## 2022-09-18 ENCOUNTER — Other Ambulatory Visit: Payer: Self-pay | Admitting: Maternal & Fetal Medicine

## 2022-09-18 ENCOUNTER — Encounter (HOSPITAL_COMMUNITY): Payer: Self-pay | Admitting: Obstetrics and Gynecology

## 2022-09-18 ENCOUNTER — Ambulatory Visit: Payer: Medicaid Other

## 2022-09-18 ENCOUNTER — Ambulatory Visit: Payer: Medicaid Other | Admitting: *Deleted

## 2022-09-18 ENCOUNTER — Encounter: Payer: Self-pay | Admitting: *Deleted

## 2022-09-18 ENCOUNTER — Inpatient Hospital Stay (HOSPITAL_COMMUNITY)
Admission: AD | Admit: 2022-09-18 | Discharge: 2022-09-18 | Disposition: A | Discharge status: Home / Self Care | Payer: Medicaid Other | Attending: Obstetrics and Gynecology | Admitting: Obstetrics and Gynecology

## 2022-09-18 ENCOUNTER — Ambulatory Visit: Payer: Medicaid Other | Attending: Maternal & Fetal Medicine

## 2022-09-18 ENCOUNTER — Inpatient Hospital Stay (HOSPITAL_BASED_OUTPATIENT_CLINIC_OR_DEPARTMENT_OTHER): Payer: Medicaid Other

## 2022-09-18 DIAGNOSIS — Z3A36 36 weeks gestation of pregnancy: Secondary | ICD-10-CM | POA: Diagnosis not present

## 2022-09-18 DIAGNOSIS — O34219 Maternal care for unspecified type scar from previous cesarean delivery: Secondary | ICD-10-CM | POA: Diagnosis not present

## 2022-09-18 DIAGNOSIS — O30043 Twin pregnancy, dichorionic/diamniotic, third trimester: Secondary | ICD-10-CM

## 2022-09-18 DIAGNOSIS — Z3689 Encounter for other specified antenatal screening: Secondary | ICD-10-CM

## 2022-09-18 DIAGNOSIS — Z369 Encounter for antenatal screening, unspecified: Secondary | ICD-10-CM | POA: Insufficient documentation

## 2022-09-18 DIAGNOSIS — O283 Abnormal ultrasonic finding on antenatal screening of mother: Secondary | ICD-10-CM | POA: Diagnosis present

## 2022-09-18 DIAGNOSIS — O368130 Decreased fetal movements, third trimester, not applicable or unspecified: Secondary | ICD-10-CM | POA: Diagnosis present

## 2022-09-18 LAB — OB RESULTS CONSOLE GBS: GBS: POSITIVE

## 2022-09-18 NOTE — Procedures (Signed)
[name] 1995/09/21 [redacted]w[redacted]d   Fetus B Non-Stress Test Interpretation for 09/18/22 BPP with NST  Indication: Unsatisfactory BPP  Fetal Heart Rate Fetus B Mode: External Baseline Rate (B): 130 BPM Variability: Moderate Accelerations: 15 x 15 Decelerations: None  Uterine Activity Mode: Palpation, Toco Contraction Frequency (min): 3 uc's with ui Contraction Duration (sec): 60-120 Contraction Quality: Mild Resting Tone Palpated: Relaxed Resting Time: Adequate  Interpretation (Baby B - Fetal Testing) Nonstress Test Interpretation (Baby B): Reactive Overall Impression (Baby B): Reassuring for gestational age Comments (Baby B): Dr. Judeth Cornfield reviewed tracing  [name] 05/11/1995 [redacted]w[redacted]d  Fetus A Non-Stress Test Interpretation for 09/18/22  Indication:  di di twins  Fetal Heart Rate A Mode: External Baseline Rate (A): 135 bpm Variability: Moderate Accelerations: 15 x 15 Decelerations: None Multiple birth?: Yes  Uterine Activity Mode: Palpation, Toco Contraction Frequency (min): 3 uc's with ui Contraction Duration (sec): 60-120 Contraction Quality: Mild Resting Tone Palpated: Relaxed Resting Time: Adequate  Interpretation (Fetal Testing) Nonstress Test Interpretation: Reactive Overall Impression: Reassuring for gestational age Comments: Dr. Judeth Cornfield reviewed tracing

## 2022-09-18 NOTE — MAU Note (Signed)
.  Kristina Robinson is a 27 y.o. at [redacted]w[redacted]d here in MAU reporting: Sent from MFM for BPP 6/10 for baby B. Denies VB or LOF. +FM for [name], DFM for baby B.   Pain score: 0 Vitals:   09/18/22 1839  BP: 109/78  Pulse: (!) 110  Resp: 14  Temp: 97.7 F (36.5 C)  SpO2: 100%     FHT:a-135 B-140

## 2022-09-18 NOTE — MAU Provider Note (Signed)
History     CSN: [id number]  Arrival date and time: 09/18/22 1814   None     Chief Complaint  Patient presents with   Decreased Fetal Movement   HPI This is a 27 year old G3 P2-0-0-2 at 36 weeks and 1 day.  Her pregnancy is complicated by Mercie Eon twins.  She was seen in the MFM office and had a BPP 6 out of 10 for baby B.  She was sent to the hospital for evaluation.  She denies any does note slightly decreased fetal movement of baby B.  No leaking fluid, no vaginal bleeding.  OB History     Gravida  3   Para  2   Term  2   Preterm      AB      Living  2      SAB      IAB      Ectopic      Multiple  0   Live Births  2           Past Medical History:  Diagnosis Date   Medical history non-contributory     Past Surgical History:  Procedure Laterality Date   CESAREAN SECTION N/A 10/18/2018   Procedure: CESAREAN SECTION;  Surgeon: [name], DO;  Location: [facility] LD ORS;  Service: Obstetrics;  Laterality: N/A;   NO PAST SURGERIES      Family History  Problem Relation Age of Onset   Healthy Mother    Asthma Father    Asthma Brother    Cancer Paternal Grandmother    Diabetes Neg Hx    Heart disease Neg Hx    Hypertension Neg Hx     Social History   Tobacco Use   Smoking status: Former    Types: Cigarettes   Smokeless tobacco: Never   Tobacco comments:    quit Dec  Vaping Use   Vaping status: Former   Devices: rare, none since Dec  Substance Use Topics   Alcohol use: Never   Drug use: Not Currently    Types: Marijuana    Comment: last use november 23    Allergies: No Known Allergies  Medications Prior to Admission  Medication Sig Dispense Refill Last Dose   Prenatal Vit-Fe Fumarate-FA (PREPLUS) 27-1 MG TABS Take 1 tablet by mouth daily.   09/17/2022   acetaminophen (TYLENOL) 325 MG tablet Take 2 tablets (650 mg total) by mouth every 4 (four) hours as needed (for pain scale < 4). 30 tablet 1     Review of Systems Physical Exam    Blood pressure 109/78, pulse (!) 110, temperature 97.7 F (36.5 C), temperature source Oral, resp. rate 14, SpO2 100%, unknown if currently breastfeeding.  Physical Exam Vitals and nursing note reviewed.  Constitutional:      Appearance: Normal appearance.  Neurological:     Mental Status: She is alert.  Psychiatric:        Mood and Affect: Mood normal.        Behavior: Behavior normal.        Thought Content: Thought content normal.        Judgment: Judgment normal.     MAU Course  Procedures NST: [name] Baseline: 140s  Variability: moderate Accelerations: present  Decelerations: none Contractions: none  NST: Baby B Baseline: 145  Variability: moderate Accelerations: present  Decelerations: none Contractions: none  MDM BPP 8/8  Assessment and Plan   1. Dichorionic diamniotic twin pregnancy in third trimester  2. [redacted] weeks gestation of pregnancy   3. NST (non-stress test) reactive    Discharge to home.   Kristina Robinson 09/18/2022, 8:00 PM

## 2022-09-20 NOTE — Patient Instructions (Signed)
Cielle Aguila  09/20/2022   Your procedure is scheduled on:  10/03/2022  Arrive at 1000 at Entrance C on [address] at [facility]  and [employer]. You are invited to use the FREE valet parking or use the Visitor's parking deck.  Pick up the phone at the desk and dial [phone].  Call this number if you have problems the morning of surgery: [phone]  Remember:   Do not eat food:(After Midnight) Desps de medianoche.  Do not drink clear liquids: (After Midnight) Desps de medianoche.  Take these medicines the morning of surgery with A SIP OF WATER:  none   Do not wear jewelry, make-up or nail polish.  Do not wear lotions, powders, or perfumes. Do not wear deodorant.  Do not shave 48 hours prior to surgery.  Do not bring valuables to the hospital.  [facility] is not   responsible for any belongings or valuables brought to the hospital.  Contacts, dentures or bridgework may not be worn into surgery.  Leave suitcase in the car. After surgery it may be brought to your room.  For patients admitted to the hospital, checkout time is 11:00 AM the day of              discharge.      Please read over the following fact sheets that you were given:     Preparing for Surgery

## 2022-09-21 ENCOUNTER — Encounter (HOSPITAL_COMMUNITY): Payer: Self-pay

## 2022-09-27 ENCOUNTER — Other Ambulatory Visit: Payer: Self-pay | Admitting: *Deleted

## 2022-09-27 DIAGNOSIS — O30043 Twin pregnancy, dichorionic/diamniotic, third trimester: Secondary | ICD-10-CM

## 2022-09-29 ENCOUNTER — Ambulatory Visit: Payer: Medicaid Other | Attending: Obstetrics | Admitting: *Deleted

## 2022-09-29 ENCOUNTER — Encounter: Payer: Self-pay | Admitting: *Deleted

## 2022-09-29 ENCOUNTER — Ambulatory Visit: Payer: Medicaid Other

## 2022-09-29 ENCOUNTER — Ambulatory Visit (HOSPITAL_BASED_OUTPATIENT_CLINIC_OR_DEPARTMENT_OTHER): Payer: Medicaid Other

## 2022-09-29 DIAGNOSIS — O30043 Twin pregnancy, dichorionic/diamniotic, third trimester: Secondary | ICD-10-CM | POA: Diagnosis present

## 2022-09-29 DIAGNOSIS — O285 Abnormal chromosomal and genetic finding on antenatal screening of mother: Secondary | ICD-10-CM

## 2022-09-29 DIAGNOSIS — Z3A37 37 weeks gestation of pregnancy: Secondary | ICD-10-CM | POA: Insufficient documentation

## 2022-09-30 NOTE — Anesthesia Preprocedure Evaluation (Signed)
Anesthesia Evaluation  Patient identified by MRN, date of birth, ID band Patient awake    Reviewed: Allergy & Precautions, NPO status , Patient's Chart, lab work & pertinent test results  History of Anesthesia Complications (+) PONV and history of anesthetic complications (issues w/ zofran and phenergan after appendectomy)  Airway Mallampati: II  TM Distance: >3 FB Neck ROM: Full    Dental no notable dental hx. (+) Teeth Intact, Dental Advisory Given   Pulmonary former smoker   Pulmonary exam normal breath sounds clear to auscultation       Cardiovascular negative cardio ROS Normal cardiovascular exam Rhythm:Regular Rate:Normal     Neuro/Psych negative neurological ROS  negative psych ROS   GI/Hepatic negative GI ROS, Neg liver ROS,,,  Endo/Other  negative endocrine ROS    Renal/GU negative Renal ROS  negative genitourinary   Musculoskeletal negative musculoskeletal ROS (+)    Abdominal   Peds negative pediatric ROS (+)  Hematology negative hematology ROS (+) Hb 13.3, plt 157   Anesthesia Other Findings   Reproductive/Obstetrics (+) Pregnancy Twins, repeat c section                             Anesthesia Physical Anesthesia Plan  ASA: 2  Anesthesia Plan: Spinal   Post-op Pain Management: Regional block, Toradol IV (intra-op)* and Ofirmev IV (intra-op)*   Induction:   PONV Risk Score and Plan: 3 and Ondansetron, Dexamethasone and Treatment may vary due to age or medical condition  Airway Management Planned: Natural Airway and Nasal Cannula  Additional Equipment: None  Intra-op Plan:   Post-operative Plan:   Informed Consent: I have reviewed the patients History and Physical, chart, labs and discussed the procedure including the risks, benefits and alternatives for the proposed anesthesia with the patient or authorized representative who has indicated his/her understanding  and acceptance.       Plan Discussed with: CRNA  Anesthesia Plan Comments: (Issues w/ repeated doses of zofran and phenergan postop from lap appy, per notes: "POD0 patient had persistent nausea and vomiting. She received Zofran 4mg  and phenergan 12.5 in PACU. Once on the floor she received another dose of zofran 4mg . We stopper her opiates and gave ehr Toradol for pain control.  Around 4:15 pm POD0 she complained of inability to extend hands bilaterally, numbness and tingling in legs bilaterally, continuous movement in bed, anxious.  Appreared to be dystonic reaction likely to antiemetics. Over time we administered 50 mg total IV benadryl, 2 IV ativan with resolution of distress and improved movement of extremities"  Has had zofran w/ last section and has been taking it PO for nausea in other pregnancies. I suspect her symptoms were from repeated dosing of antiemetics. Will give zofran and avoid phenergan and repeated dosing in short intervals.  )       Anesthesia Quick Evaluation

## 2022-10-02 ENCOUNTER — Ambulatory Visit (HOSPITAL_COMMUNITY)
Admission: RE | Admit: 2022-10-02 | Discharge: 2022-10-02 | Disposition: A | Discharge status: Home / Self Care | Payer: Medicaid Other | Source: Ambulatory Visit | Attending: Obstetrics and Gynecology | Admitting: Obstetrics and Gynecology

## 2022-10-02 ENCOUNTER — Encounter (HOSPITAL_COMMUNITY): Payer: Self-pay | Admitting: Obstetrics and Gynecology

## 2022-10-02 VITALS — Ht 68.0 in | Wt 165.0 lb

## 2022-10-02 DIAGNOSIS — Z01812 Encounter for preprocedural laboratory examination: Secondary | ICD-10-CM | POA: Diagnosis not present

## 2022-10-02 DIAGNOSIS — O30043 Twin pregnancy, dichorionic/diamniotic, third trimester: Secondary | ICD-10-CM | POA: Diagnosis not present

## 2022-10-02 HISTORY — DX: Nausea with vomiting, unspecified: Z98.890

## 2022-10-02 LAB — CBC
HCT: 40.8 % (ref 36.0–46.0)
Hemoglobin: 13.3 g/dL (ref 12.0–15.0)
MCH: 29.1 pg (ref 26.0–34.0)
MCHC: 32.6 g/dL (ref 30.0–36.0)
MCV: 89.3 fL (ref 80.0–100.0)
Platelets: 157 10*3/uL (ref 150–400)
RBC: 4.57 MIL/uL (ref 3.87–5.11)
RDW: 21.2 % — ABNORMAL HIGH (ref 11.5–15.5)
WBC: 6.3 10*3/uL (ref 4.0–10.5)
nRBC: 0 % (ref 0.0–0.2)

## 2022-10-02 LAB — TYPE AND SCREEN
ABO/RH(D): A POS
Antibody Screen: NEGATIVE

## 2022-10-02 LAB — RPR: RPR Ser Ql: NONREACTIVE

## 2022-10-02 NOTE — H&P (Signed)
Kristina Robinson is a 27 y.o. female [phone] [redacted]w[redacted]d with DCDA twin pregnancy.  She reports no LOF, VB, Contractions. Normal FM.   Pregnancy c/b: History of cesarean delivery:  in 2020 for fetal distress, followed by VBAC in 2021. Desires repeat cesarean due to twin pregnancy DCDA twin pregnancy: last growth 7/22 [name] 54% (6lb5oz), baby B 23% (5lb11oz), discordance 10% Anemia: 5/31 hgb 8.2, now improved to 13.3 s/p IV iron infusion GBS positive  OB History     Gravida  3   Para  2   Term  2   Preterm      AB      Living  2      SAB      IAB      Ectopic      Multiple  0   Live Births  2          Past Medical History:  Diagnosis Date   Medical history non-contributory    PONV (postoperative nausea and vomiting)    had a reaction post op to meds from appendectomy see care everywhere [facility]   Past Surgical History:  Procedure Laterality Date   APPENDECTOMY     CESAREAN SECTION N/A 10/18/2018   Procedure: CESAREAN SECTION;  Surgeon: [name], DO;  Location: [facility] LD ORS;  Service: Obstetrics;  Laterality: N/A;   NO PAST SURGERIES     Family History: family history includes Asthma in her brother and father; Cancer in her paternal grandmother; Healthy in her mother. Social History:  reports that she quit smoking about 3 years ago. Her smoking use included cigarettes. She started smoking about 9 months ago. She has a 0.4 pack-year smoking history. She has never used smokeless tobacco. She reports that she does not currently use drugs after having used the following drugs: Marijuana. She reports that she does not drink alcohol.     Maternal Diabetes: No Genetic Screening: Normal Maternal Ultrasounds/Referrals: Normal Fetal Ultrasounds or other Referrals:  Referred to Materal Fetal Medicine  Maternal Substance Abuse:  No Significant Maternal Medications:  None Significant Maternal Lab Results:  Group B Strep positive Other Comments:  None  Review of Systems  Per HPI Exam Physical Exam    unknown if currently breastfeeding. Gen: NAD, resting comfortably CVS: normal pulses Lungs: Nonlabored respirations Abd: Gravid abdomen Ext: no calf edema or tenderness  Prenatal labs: ABO, Rh:  --/--/A POS (08/05 1012) Antibody: NEG (08/05 1012) Rubella: Immune (12/18 0000) RPR: Nonreactive (12/18 0000)  HBsAg: Negative (12/18 0000)  HIV: Non-reactive (12/18 0000)  GBS: Positive/-- (07/22 0000)   Assessment/Plan: 47W [id number] @ [redacted]w[redacted]d, DCDA twin pregnancy, h/o cesarean, desire for permanent sterilization - Plan for repeat cesarean delivery with bilateral salpingectomy - Medicaid sterilization consent signed 08/30/22 - Procedure reviewed in detail. Reviewed risk of infection, bleeding, blood transfusion, hysterectomy, damage to surrounding organs, injury to infant(s), sterilization failure, regret. All questions answered.  - Ancef 2g, TXA ordered.   [name] 10/02/2022, 2:41 PM

## 2022-10-03 ENCOUNTER — Other Ambulatory Visit: Payer: Self-pay

## 2022-10-03 ENCOUNTER — Encounter (HOSPITAL_COMMUNITY)
Admission: RE | Disposition: A | Discharge status: Home / Self Care | Payer: Self-pay | Source: Home / Self Care | Attending: Obstetrics and Gynecology

## 2022-10-03 ENCOUNTER — Encounter (HOSPITAL_COMMUNITY): Payer: Self-pay | Admitting: Obstetrics and Gynecology

## 2022-10-03 ENCOUNTER — Inpatient Hospital Stay (HOSPITAL_COMMUNITY)
Admission: RE | Admit: 2022-10-03 | Discharge: 2022-10-05 | DRG: 785 | Disposition: A | Discharge status: Home / Self Care | Payer: Medicaid Other | Attending: Obstetrics and Gynecology | Admitting: Obstetrics and Gynecology

## 2022-10-03 ENCOUNTER — Inpatient Hospital Stay (HOSPITAL_COMMUNITY): Payer: Medicaid Other | Admitting: Anesthesiology

## 2022-10-03 DIAGNOSIS — Z87891 Personal history of nicotine dependence: Secondary | ICD-10-CM

## 2022-10-03 DIAGNOSIS — Z3A38 38 weeks gestation of pregnancy: Secondary | ICD-10-CM

## 2022-10-03 DIAGNOSIS — O34211 Maternal care for low transverse scar from previous cesarean delivery: Secondary | ICD-10-CM | POA: Diagnosis present

## 2022-10-03 DIAGNOSIS — Z302 Encounter for sterilization: Secondary | ICD-10-CM | POA: Diagnosis not present

## 2022-10-03 DIAGNOSIS — O99824 Streptococcus B carrier state complicating childbirth: Secondary | ICD-10-CM | POA: Diagnosis present

## 2022-10-03 DIAGNOSIS — O30043 Twin pregnancy, dichorionic/diamniotic, third trimester: Secondary | ICD-10-CM | POA: Diagnosis present

## 2022-10-03 DIAGNOSIS — O9902 Anemia complicating childbirth: Secondary | ICD-10-CM | POA: Diagnosis present

## 2022-10-03 DIAGNOSIS — Z98891 History of uterine scar from previous surgery: Principal | ICD-10-CM

## 2022-10-03 HISTORY — PX: TUBAL LIGATION: SHX77

## 2022-10-03 SURGERY — Surgical Case
Anesthesia: Spinal

## 2022-10-03 MED ORDER — SCOPOLAMINE 1 MG/3DAYS TD PT72
1.0000 | MEDICATED_PATCH | Freq: Once | TRANSDERMAL | Status: DC
  Administered 2022-10-03: 1.5 mg via TRANSDERMAL
  Filled 2022-10-03: qty 1

## 2022-10-03 MED ORDER — TRANEXAMIC ACID-NACL 1000-0.7 MG/100ML-% IV SOLN
INTRAVENOUS | Status: DC | PRN
Start: 2022-10-03 — End: 2022-10-03
  Administered 2022-10-03: 1000 mg via INTRAVENOUS

## 2022-10-03 MED ORDER — TETANUS-DIPHTH-ACELL PERTUSSIS 5-2.5-18.5 LF-MCG/0.5 IM SUSY: 0.5000 mL | PREFILLED_SYRINGE | Freq: Once | INTRAMUSCULAR | Status: DC

## 2022-10-03 MED ORDER — PHENYLEPHRINE HCL-NACL 20-0.9 MG/250ML-% IV SOLN
INTRAVENOUS | Status: DC | PRN
  Administered 2022-10-03: 60 ug/min via INTRAVENOUS

## 2022-10-03 MED ORDER — OXYCODONE HCL 5 MG PO TABS: 5.0000 mg | ORAL_TABLET | Freq: Once | ORAL | Status: DC | PRN

## 2022-10-03 MED ORDER — COCONUT OIL OIL: 1.0000 | TOPICAL_OIL | Status: DC | PRN

## 2022-10-03 MED ORDER — IBUPROFEN 600 MG PO TABS
600.0000 mg | ORAL_TABLET | Freq: Four times a day (QID) | ORAL | Status: DC
  Administered 2022-10-04 – 2022-10-05 (×2): 600 mg via ORAL
  Filled 2022-10-03 (×3): qty 1

## 2022-10-03 MED ORDER — FENTANYL CITRATE (PF) 100 MCG/2ML IJ SOLN
INTRAMUSCULAR | Status: AC
  Filled 2022-10-03: qty 2

## 2022-10-03 MED ORDER — SIMETHICONE 80 MG PO CHEW
80.0000 mg | CHEWABLE_TABLET | Freq: Three times a day (TID) | ORAL | Status: DC
  Administered 2022-10-03 – 2022-10-05 (×4): 80 mg via ORAL
  Filled 2022-10-03 (×4): qty 1

## 2022-10-03 MED ORDER — HYDROMORPHONE HCL 1 MG/ML IJ SOLN: 0.2500 mg | INTRAMUSCULAR | Status: DC | PRN

## 2022-10-03 MED ORDER — SODIUM CHLORIDE 0.9% FLUSH: 3.0000 mL | INTRAVENOUS | Status: DC | PRN

## 2022-10-03 MED ORDER — MORPHINE SULFATE (PF) 0.5 MG/ML IJ SOLN
INTRAMUSCULAR | Status: DC | PRN
  Administered 2022-10-03: .15 mg via INTRATHECAL

## 2022-10-03 MED ORDER — ONDANSETRON HCL 4 MG/2ML IJ SOLN
INTRAMUSCULAR | Status: DC | PRN
  Administered 2022-10-03: 4 mg via INTRAVENOUS

## 2022-10-03 MED ORDER — OXYTOCIN-SODIUM CHLORIDE 30-0.9 UT/500ML-% IV SOLN
INTRAVENOUS | Status: DC | PRN
  Administered 2022-10-03: 300 mL via INTRAVENOUS

## 2022-10-03 MED ORDER — ACETAMINOPHEN 500 MG PO TABS: 1000.0000 mg | ORAL_TABLET | Freq: Four times a day (QID) | ORAL | Status: DC

## 2022-10-03 MED ORDER — SOD CITRATE-CITRIC ACID 500-334 MG/5ML PO SOLN
30.0000 mL | ORAL | Status: AC
  Administered 2022-10-03: 30 mL via ORAL

## 2022-10-03 MED ORDER — BUPIVACAINE IN DEXTROSE 0.75-8.25 % IT SOLN
INTRATHECAL | Status: DC | PRN
  Administered 2022-10-03: 1.5 mL via INTRATHECAL

## 2022-10-03 MED ORDER — NALOXONE HCL 4 MG/10ML IJ SOLN: 1.0000 ug/kg/h | INTRAVENOUS | Status: DC | PRN

## 2022-10-03 MED ORDER — DIPHENHYDRAMINE HCL 25 MG PO CAPS: 25.0000 mg | ORAL_CAPSULE | ORAL | Status: DC | PRN

## 2022-10-03 MED ORDER — OXYTOCIN-SODIUM CHLORIDE 30-0.9 UT/500ML-% IV SOLN
2.5000 [IU]/h | INTRAVENOUS | Status: AC
  Administered 2022-10-03: 2.5 [IU]/h via INTRAVENOUS
  Filled 2022-10-03: qty 500

## 2022-10-03 MED ORDER — DEXAMETHASONE SODIUM PHOSPHATE 10 MG/ML IJ SOLN
INTRAMUSCULAR | Status: AC
  Filled 2022-10-03: qty 1

## 2022-10-03 MED ORDER — SIMETHICONE 80 MG PO CHEW: 80.0000 mg | CHEWABLE_TABLET | ORAL | Status: DC | PRN

## 2022-10-03 MED ORDER — KETOROLAC TROMETHAMINE 30 MG/ML IJ SOLN: 30.0000 mg | Freq: Four times a day (QID) | INTRAMUSCULAR | Status: DC | PRN

## 2022-10-03 MED ORDER — STERILE WATER FOR IRRIGATION IR SOLN
Status: DC | PRN
  Administered 2022-10-03: 1000 mL

## 2022-10-03 MED ORDER — FENTANYL CITRATE (PF) 100 MCG/2ML IJ SOLN
INTRAMUSCULAR | Status: DC | PRN
  Administered 2022-10-03: 15 ug via INTRATHECAL

## 2022-10-03 MED ORDER — ONDANSETRON HCL 4 MG/2ML IJ SOLN
INTRAMUSCULAR | Status: AC
  Filled 2022-10-03: qty 2

## 2022-10-03 MED ORDER — KETOROLAC TROMETHAMINE 30 MG/ML IJ SOLN: 30.0000 mg | Freq: Once | INTRAMUSCULAR | Status: DC | PRN

## 2022-10-03 MED ORDER — SOD CITRATE-CITRIC ACID 500-334 MG/5ML PO SOLN
ORAL | Status: AC
  Filled 2022-10-03: qty 30

## 2022-10-03 MED ORDER — AMISULPRIDE (ANTIEMETIC) 5 MG/2ML IV SOLN: 10.0000 mg | Freq: Once | INTRAVENOUS | Status: DC | PRN

## 2022-10-03 MED ORDER — MORPHINE SULFATE (PF) 0.5 MG/ML IJ SOLN
INTRAMUSCULAR | Status: AC
  Filled 2022-10-03: qty 10

## 2022-10-03 MED ORDER — OXYCODONE HCL 5 MG/5ML PO SOLN: 5.0000 mg | Freq: Once | ORAL | Status: DC | PRN

## 2022-10-03 MED ORDER — ACETAMINOPHEN 10 MG/ML IV SOLN
INTRAVENOUS | Status: AC
  Filled 2022-10-03: qty 100

## 2022-10-03 MED ORDER — PHENYLEPHRINE HCL-NACL 20-0.9 MG/250ML-% IV SOLN
INTRAVENOUS | Status: AC
  Filled 2022-10-03: qty 250

## 2022-10-03 MED ORDER — PRENATAL MULTIVITAMIN CH
1.0000 | ORAL_TABLET | Freq: Every day | ORAL | Status: DC
  Administered 2022-10-04 – 2022-10-05 (×2): 1 via ORAL
  Filled 2022-10-03 (×2): qty 1

## 2022-10-03 MED ORDER — LACTATED RINGERS IV SOLN: INTRAVENOUS | Status: DC

## 2022-10-03 MED ORDER — OXYTOCIN-SODIUM CHLORIDE 30-0.9 UT/500ML-% IV SOLN
INTRAVENOUS | Status: AC
  Filled 2022-10-03: qty 500

## 2022-10-03 MED ORDER — MEPERIDINE HCL 25 MG/ML IJ SOLN: 6.2500 mg | INTRAMUSCULAR | Status: DC | PRN

## 2022-10-03 MED ORDER — KETOROLAC TROMETHAMINE 30 MG/ML IJ SOLN
INTRAMUSCULAR | Status: AC
  Filled 2022-10-03: qty 1

## 2022-10-03 MED ORDER — ZOLPIDEM TARTRATE 5 MG PO TABS: 5.0000 mg | ORAL_TABLET | Freq: Every evening | ORAL | Status: DC | PRN

## 2022-10-03 MED ORDER — DIPHENHYDRAMINE HCL 50 MG/ML IJ SOLN: 12.5000 mg | INTRAMUSCULAR | Status: DC | PRN

## 2022-10-03 MED ORDER — SENNOSIDES-DOCUSATE SODIUM 8.6-50 MG PO TABS
2.0000 | ORAL_TABLET | ORAL | Status: DC
  Administered 2022-10-03 – 2022-10-05 (×3): 2 via ORAL
  Filled 2022-10-03 (×3): qty 2

## 2022-10-03 MED ORDER — WITCH HAZEL-GLYCERIN EX PADS: 1.0000 | MEDICATED_PAD | CUTANEOUS | Status: DC | PRN

## 2022-10-03 MED ORDER — NALOXONE HCL 0.4 MG/ML IJ SOLN: 0.4000 mg | INTRAMUSCULAR | Status: DC | PRN

## 2022-10-03 MED ORDER — DEXAMETHASONE SODIUM PHOSPHATE 10 MG/ML IJ SOLN
INTRAMUSCULAR | Status: DC | PRN
  Administered 2022-10-03: 10 mg via INTRAVENOUS

## 2022-10-03 MED ORDER — KETOROLAC TROMETHAMINE 30 MG/ML IJ SOLN
INTRAMUSCULAR | Status: DC | PRN
Start: 2022-10-03 — End: 2022-10-03
  Administered 2022-10-03: 30 mg via INTRAVENOUS

## 2022-10-03 MED ORDER — CEFAZOLIN SODIUM-DEXTROSE 2-4 GM/100ML-% IV SOLN
2.0000 g | INTRAVENOUS | Status: AC
  Administered 2022-10-03: 2 g via INTRAVENOUS

## 2022-10-03 MED ORDER — OXYCODONE HCL 5 MG PO TABS
5.0000 mg | ORAL_TABLET | ORAL | Status: DC | PRN
  Administered 2022-10-04 – 2022-10-05 (×3): 5 mg via ORAL
  Filled 2022-10-03 (×3): qty 1

## 2022-10-03 MED ORDER — MENTHOL 3 MG MT LOZG: 1.0000 | LOZENGE | OROMUCOSAL | Status: DC | PRN

## 2022-10-03 MED ORDER — PHENYLEPHRINE HCL (PRESSORS) 10 MG/ML IV SOLN
INTRAVENOUS | Status: DC | PRN
Start: 2022-10-03 — End: 2022-10-03
  Administered 2022-10-03: 160 ug via INTRAVENOUS
  Administered 2022-10-03: 80 ug via INTRAVENOUS

## 2022-10-03 MED ORDER — TRANEXAMIC ACID-NACL 1000-0.7 MG/100ML-% IV SOLN
INTRAVENOUS | Status: AC
  Filled 2022-10-03: qty 100

## 2022-10-03 MED ORDER — ACETAMINOPHEN 10 MG/ML IV SOLN
INTRAVENOUS | Status: DC | PRN
  Administered 2022-10-03: 1000 mg via INTRAVENOUS

## 2022-10-03 MED ORDER — HYDROMORPHONE HCL 1 MG/ML IJ SOLN: 0.2000 mg | INTRAMUSCULAR | Status: DC | PRN

## 2022-10-03 MED ORDER — DIBUCAINE (PERIANAL) 1 % EX OINT: 1.0000 | TOPICAL_OINTMENT | CUTANEOUS | Status: DC | PRN

## 2022-10-03 MED ORDER — SODIUM CHLORIDE 0.9 % IR SOLN
Status: DC | PRN
  Administered 2022-10-03: 1

## 2022-10-03 MED ORDER — CEFAZOLIN SODIUM-DEXTROSE 2-4 GM/100ML-% IV SOLN
INTRAVENOUS | Status: AC
  Filled 2022-10-03: qty 100

## 2022-10-03 MED ORDER — DIPHENHYDRAMINE HCL 25 MG PO CAPS: 25.0000 mg | ORAL_CAPSULE | Freq: Four times a day (QID) | ORAL | Status: DC | PRN

## 2022-10-03 MED ORDER — ACETAMINOPHEN 500 MG PO TABS
1000.0000 mg | ORAL_TABLET | Freq: Four times a day (QID) | ORAL | Status: DC
  Administered 2022-10-03 – 2022-10-05 (×6): 1000 mg via ORAL
  Filled 2022-10-03 (×7): qty 2

## 2022-10-03 MED ORDER — KETOROLAC TROMETHAMINE 30 MG/ML IJ SOLN
30.0000 mg | Freq: Four times a day (QID) | INTRAMUSCULAR | Status: AC
  Administered 2022-10-03 – 2022-10-04 (×3): 30 mg via INTRAVENOUS
  Filled 2022-10-03 (×4): qty 1

## 2022-10-03 SURGICAL SUPPLY — 33 items
APL PRP STRL LF DISP 70% ISPRP (MISCELLANEOUS) ×4
APL SKNCLS STERI-STRIP NONHPOA (GAUZE/BANDAGES/DRESSINGS) ×4
BENZOIN TINCTURE PRP APPL 2/3 (GAUZE/BANDAGES/DRESSINGS) ×2 IMPLANT
CHLORAPREP W/TINT 26 (MISCELLANEOUS) ×4 IMPLANT
CLAMP UMBILICAL CORD (MISCELLANEOUS) ×2 IMPLANT
CLIP FILSHIE TUBAL LIGA STRL (Clip) IMPLANT
CLOTH BEACON ORANGE TIMEOUT ST (SAFETY) ×2 IMPLANT
DRSG OPSITE POSTOP 4X10 (GAUZE/BANDAGES/DRESSINGS) ×2 IMPLANT
ELECT REM PT RETURN 9FT ADLT (ELECTROSURGICAL) ×2
ELECTRODE REM PT RTRN 9FT ADLT (ELECTROSURGICAL) ×2 IMPLANT
EXTRACTOR VACUUM BELL STYLE (SUCTIONS) IMPLANT
GLOVE BIOGEL M STER SZ 6 (GLOVE) ×2 IMPLANT
GLOVE BIOGEL PI IND STRL 6.5 (GLOVE) ×2 IMPLANT
GLOVE BIOGEL PI IND STRL 7.0 (GLOVE) ×4 IMPLANT
GOWN STRL REUS W/TWL LRG LVL3 (GOWN DISPOSABLE) ×4 IMPLANT
HEMOSTAT ARISTA ABSORB 3G PWDR (HEMOSTASIS) IMPLANT
KIT ABG SYR 3ML LUER SLIP (SYRINGE) IMPLANT
LIGASURE IMPACT 36 18CM CVD LR (INSTRUMENTS) IMPLANT
NDL HYPO 25X5/8 SAFETYGLIDE (NEEDLE) IMPLANT
NEEDLE HYPO 25X5/8 SAFETYGLIDE (NEEDLE) IMPLANT
NS IRRIG 1000ML POUR BTL (IV SOLUTION) ×2 IMPLANT
PACK C SECTION WH (CUSTOM PROCEDURE TRAY) ×2 IMPLANT
PAD OB MATERNITY 4.3X12.25 (PERSONAL CARE ITEMS) ×2 IMPLANT
RTRCTR C-SECT PINK 25CM LRG (MISCELLANEOUS) ×2 IMPLANT
STRIP CLOSURE SKIN 1/2X4 (GAUZE/BANDAGES/DRESSINGS) ×2 IMPLANT
SUT MNCRL 0 VIOLET CTX 36 (SUTURE) ×4 IMPLANT
SUT VIC AB 0 CT1 36 (SUTURE) ×4 IMPLANT
SUT VIC AB 3-0 CT1 27 (SUTURE) ×2
SUT VIC AB 3-0 CT1 TAPERPNT 27 (SUTURE) IMPLANT
SUT VIC AB 4-0 KS 27 (SUTURE) ×2 IMPLANT
TOWEL OR 17X24 6PK STRL BLUE (TOWEL DISPOSABLE) ×2 IMPLANT
TRAY FOLEY W/BAG SLVR 14FR LF (SET/KITS/TRAYS/PACK) ×2 IMPLANT
WATER STERILE IRR 1000ML POUR (IV SOLUTION) ×2 IMPLANT

## 2022-10-03 NOTE — Plan of Care (Signed)
  Problem: Education: Goal: Knowledge of General Education information will improve Description: Including pain rating scale, medication(s)/side effects and non-pharmacologic comfort measures Outcome: Progressing   Problem: Health Behavior/Discharge Planning: Goal: Ability to manage health-related needs will improve Outcome: Progressing   Problem: Clinical Measurements: Goal: Ability to maintain clinical measurements within normal limits will improve Outcome: Progressing Goal: Will remain free from infection Outcome: Progressing Goal: Diagnostic test results will improve Outcome: Progressing Goal: Respiratory complications will improve Outcome: Progressing Goal: Cardiovascular complication will be avoided Outcome: Progressing   Problem: Activity: Goal: Risk for activity intolerance will decrease Outcome: Progressing   Problem: Nutrition: Goal: Adequate nutrition will be maintained Outcome: Progressing   Problem: Coping: Goal: Level of anxiety will decrease Outcome: Progressing   Problem: Elimination: Goal: Will not experience complications related to bowel motility Outcome: Progressing Goal: Will not experience complications related to urinary retention Outcome: Progressing   Problem: Pain Managment: Goal: General experience of comfort will improve Outcome: Progressing   Problem: Safety: Goal: Ability to remain free from injury will improve Outcome: Progressing   Problem: Skin Integrity: Goal: Risk for impaired skin integrity will decrease Outcome: Progressing   Problem: Education: Goal: Knowledge of General Education information will improve Description: Including pain rating scale, medication(s)/side effects and non-pharmacologic comfort measures Outcome: Progressing   Problem: Education: Goal: Knowledge of the prescribed therapeutic regimen will improve Outcome: Progressing Goal: Understanding of sexual limitations or changes related to disease process or  condition will improve Outcome: Progressing Goal: Individualized Educational Video(s) Outcome: Progressing   Problem: Self-Concept: Goal: Communication of feelings regarding changes in body function or appearance will improve Outcome: Progressing   Problem: Skin Integrity: Goal: Demonstration of wound healing without infection will improve Outcome: Progressing   Problem: Education: Goal: Knowledge of condition will improve Outcome: Progressing Goal: Individualized Educational Video(s) Outcome: Progressing Goal: Individualized Newborn Educational Video(s) Outcome: Progressing   Problem: Activity: Goal: Will verbalize the importance of balancing activity with adequate rest periods Outcome: Progressing Goal: Ability to tolerate increased activity will improve Outcome: Progressing   Problem: Coping: Goal: Ability to identify and utilize available resources and services will improve Outcome: Progressing   Problem: Life Cycle: Goal: Chance of risk for complications during the postpartum period will decrease Outcome: Progressing   Problem: Role Relationship: Goal: Ability to demonstrate positive interaction with newborn will improve Outcome: Progressing   Problem: Skin Integrity: Goal: Demonstration of wound healing without infection will improve Outcome: Progressing

## 2022-10-03 NOTE — Transfer of Care (Signed)
Immediate Anesthesia Transfer of Care Note  Patient: Kristina Robinson  Procedure(s) Performed: CESAREAN SECTION MULTI-GESTATIONAL WITH BILATERAL SALPINGECTOMY BILATERAL TUBAL LIGATION (Bilateral)  Patient Location: PACU  Anesthesia Type:Spinal  Level of Consciousness: awake, alert , and oriented  Airway & Oxygen Therapy: Patient Spontanous Breathing  Post-op Assessment: Report given to RN and Post -op Vital signs reviewed and stable  Post vital signs: Reviewed and stable  Last Vitals:  Vitals Value Taken Time  BP 110/64   Temp    Pulse 68   Resp 14   SpO2 94     Last Pain:  Vitals:   10/03/22 1018  TempSrc: Oral         Complications: No notable events documented.

## 2022-10-03 NOTE — Interval H&P Note (Signed)
History and Physical Interval Note:  10/03/2022 11:39 AM  Kristina Robinson  has presented today for surgery, with the diagnosis of Repeat cesarean and bilateral salpingectomy, dichorionic diamniotic twins.  The various methods of treatment have been discussed with the patient and family. After consideration of risks, benefits and other options for treatment, the patient has consented to  REPEAT CESAREAN DELIVERY AND BILATERAL SALPINGECTOMY as a surgical intervention.  The patient's history has been reviewed, patient examined, no change in status, stable for surgery.  I have reviewed the patient's chart and labs.  Questions were answered to the patient's satisfaction.     [name]

## 2022-10-03 NOTE — Op Note (Signed)
CESAREAN SECTION Procedure Note  Patient: Kristina Robinson is a 27 y.o. G3P2002 @ [redacted]w[redacted]d  Preoperative Diagnosis:  Intrauterine pregnancy at 38 weeks 2 days Dichorionic diamnionic twin pregnancy History of cesarean delivery 4.   Desire for permanent sterilization  Postoperative Diagnosis: same, delivered  Procedure:  Repeat low transverse cesarean with bilateral salpingectomy     Surgeon: [name], MD  Assist: Marlow Baars, MD  Anesthesia: Spinal anesthesia   Findings: Normal appearing uterus, fallopian tubes bilaterally, and ovaries bilaterally.   [name]: Viable female infant in vertex presentation delivered at 1213 with weight 3330g (7 pounds 5.5 ounces) Apgars 8 and 9. Baby B: Viable female infant in vertex presentation delivered at 1215 with weight 2810g (6 pounds 3.1 ounces) Apgars 9 and 9.  Estimated Blood Loss:          Specimens: Placenta to pathology         Complications:  None         Disposition: PACU - hemodynamically stable.         Condition: stable    Description of Procedure: The patient was taken to the operating room where spinal anesthesia was placed and found to be adequate.  The patient was placed in the dorsal supine position.  Fetal heart tones were confirmed. Thromboguards were applied and cycling. A foley catheter was inserted and draining. Ancef 2g was given for infection prophylaxis and tranexamic acid 1g was given for hemorrhage prophylaxis. The patient was subsequently prepped and draped in the normal sterile fashion.    A low transverse skin incision was made with a scalpel and carried down to the level of the fascia with the Bovie.  The fascia was incised in the midline with the scalpel and extended laterally with curved Mayo scissors.  Kocher clamps were applied to the inferior fascial edge and the fascia was dissected off the rectus muscle sharply using the Mayo scissors.  The Kocher clamps were transferred to the superior  fascial edge and the underlying rectus muscle was dissected off with curved Mayo's scissors.  The rectus muscles then were separated in the midline.  The peritoneum was found free of adherent bowel and the peritoneal cavity was entered with Metzenbaum scissors.  The uterus was identified and the Leeanna retractor was placed intraperitoneal.  A bladder flap was then created sharply with Metzenbaum scissors and separated from the lower uterine segment digitally.   A low transverse hysterotomy was then made with a scalpel.  [name]'s amniotic sac was ruptured for clear fluid. [name] was found in the vertex presentation was delivered atraumatically and without difficulty with standard maneuvers. After 60 seconds of delayed cord clamping the cord was clamped and cut and the infant was handed off to the pediatricians. The amniotic sac for baby B was ruptured for clear fluid. Baby B was found in the vertex presentation and was delivered atraumatically and without difficulty with standard maneuvers. After 60 seconds of delayed cord clamping, the cord was clamped and cut and the infant was handed off to the pediatricians.The placental cord for baby B was tagged with an umbilical clamp.  The placentas were delivered with gentle traction on umbilical cord and manual massage of the uterine fundus. The uterus was cleared of all clot and debris.  The hysterotomy was then closed with 0 monocryl in a running locked fashion, followed by 0 Monocryl in an imbricating fashion.  The hysterotomy was found to be hemostatic. Attention was turned to the adnexa.  The Erick retractor was removed to obtain better visualization. The right mesosalpinx was identified and serially ligated with the Ligasure device to completely remove the right fallopian tube. The same was repeated on the left side. Both fallopian tubes were sent for pathology. Both sides were inspected for hemostasis. Slight oozing was noted at the distal end of the right  mesosalpinx. This was clamped and cauterized with the Ligasure device. Hemostasis was appreciated. Arista was applied to both sites. The peritoneum was closed with 3-0 vicryl in a running fashion. The fascia was closed with a 0 Vicryl suture in a continuous running fashion.  The subcutaneous tissue was irrigated and rendered hemostatic with cautery.  The skin was closed with 4-0 vicryl  in a running subcuticular fashion.  Sponge, lap and needle counts were correct. Steri strips and a Honeycomb dressing were placed on the incision.   [name] 10/03/22  1:01 PM

## 2022-10-03 NOTE — Anesthesia Postprocedure Evaluation (Signed)
Anesthesia Post Note  Patient: Kristina Robinson  Procedure(s) Performed: CESAREAN SECTION MULTI-GESTATIONAL WITH BILATERAL SALPINGECTOMY BILATERAL TUBAL LIGATION (Bilateral)     Patient location during evaluation: PACU Anesthesia Type: Spinal Level of consciousness: awake and alert and oriented Pain management: pain level controlled Vital Signs Assessment: post-procedure vital signs reviewed and stable Respiratory status: spontaneous breathing, nonlabored ventilation and respiratory function stable Cardiovascular status: blood pressure returned to baseline and stable Postop Assessment: no headache, no backache, spinal receding and patient able to bend at knees Anesthetic complications: no   No notable events documented.  Last Vitals:  Vitals:   10/03/22 1448 10/03/22 1600  BP: 109/64 114/76  Pulse: (!) 50 (!) 52  Resp: 18 18  Temp:  36.7 C  SpO2: 97% 100%    Last Pain:  Vitals:   10/03/22 1600  TempSrc: Oral  PainSc:    Pain Goal: Patients Stated Pain Goal: 4 (10/03/22 1500)                 Lannie Fields

## 2022-10-03 NOTE — Anesthesia Procedure Notes (Signed)
Spinal  Patient location during procedure: OR Start time: 10/03/2022 11:47 AM End time: 10/03/2022 11:52 AM Reason for block: surgical anesthesia Staffing Performed: anesthesiologist  Anesthesiologist: Lannie Fields, DO Performed by: Lannie Fields, DO Authorized by: Lannie Fields, DO   Preanesthetic Checklist Completed: patient identified, IV checked, risks and benefits discussed, surgical consent, monitors and equipment checked, pre-op evaluation and timeout performed Spinal Block Patient position: sitting Prep: DuraPrep and site prepped and draped Patient monitoring: cardiac monitor, continuous pulse ox and blood pressure Approach: midline Location: L3-4 Injection technique: single-shot Needle Needle type: Pencan  Needle gauge: 24 G Needle length: 9 cm Assessment Sensory level: T6 Events: CSF return Additional Notes Functioning IV was confirmed and monitors were applied. Sterile prep and drape, including hand hygiene and sterile gloves were used. The patient was positioned and the spine was prepped. The skin was anesthetized with lidocaine.  Free flow of clear CSF was obtained prior to injecting local anesthetic into the CSF.  The spinal needle aspirated freely following injection.  The needle was carefully withdrawn.  The patient tolerated the procedure well.

## 2022-10-04 NOTE — Lactation Note (Signed)
This note was copied from a baby's chart. Lactation Consultation Note Mom stated she has BF baby for 1 hr and she is still fussy and crying.  LC checked diaper and changed a void. Mom stated she is probably have to supplement. Gave choices of DBM and formula. Mom is to tired to pump at this time. RN got mom tup to BR. Mom wants to get some rest. Baby took formula well and resting. Encouraged to call for assistance as needed.  Patient Name: Kristina Robinson ZOXWR'U Date: 10/04/2022 Age:21 hours Reason for consult: Follow-up assessment;Early term 37-38.6wks;Multiple gestation   Maternal Data    Feeding Mother's Current Feeding Choice: Breast Milk and Formula Nipple Type: Slow - flow  LATCH Score       Type of Nipple: Everted at rest and after stimulation  Comfort (Breast/Nipple): Soft / non-tender         Lactation Tools Discussed/Used    Interventions    Discharge    Consult Status Consult Status: Follow-up Date: 10/04/22 (in pm) Follow-up type: In-patient    Kristina Robinson 10/04/2022, 5:32 AM

## 2022-10-04 NOTE — Lactation Note (Signed)
This note was copied from a baby's chart. Lactation Consultation Note Mom was awake when LC came in rm. Mom stated both babies has been BF recently. One is BF better than the other. Baby "A" isn't opening her mouth as wide as she needs to per mom. Mom stated baby "B" opens wide and likes to eat a lot.  Experienced BF mom states she is going to try just to BF. Discussed alternating babies to breast and BF one at a time.  Asked mom if she would like to pump, mom replied yes. Mom shown how to use DEBP & how to disassemble, clean, & reassemble parts.  Mom encouraged to feed baby 8-12 times/24 hours and with feeding cues.  Discussed BF, milk supply, milk storage, STS, I&O. Encouraged mom to call for [facility] for next feeding.  Patient Name: Kristina Robinson ZOXWR'U Date: 10/04/2022 Age:71 hours Reason for consult: Initial assessment;Early term 37-38.6wks;Multiple gestation   Maternal Data Has patient been taught Hand Expression?: Yes Does the patient have breastfeeding experience prior to this delivery?: Yes How long did the patient breastfeed?: 3 months  Feeding    LATCH Score       Type of Nipple: Everted at rest and after stimulation            Lactation Tools Discussed/Used    Interventions Interventions: DEBP;Breast feeding basics reviewed;LC Services brochure;Education  Discharge    Consult Status Consult Status: Follow-up Date: 10/04/22 Follow-up type: In-patient    Kristina Robinson 10/04/2022, 2:47 AM

## 2022-10-04 NOTE — Progress Notes (Signed)
Patient is doing well.  She is tolerating PO, ambulating, voiding.  Pain is controlled, but increasingly slowly this AM.   Lochia is appropriate  Vitals:   10/03/22 1600 10/03/22 2005 10/03/22 2358 10/04/22 0500  BP: 114/76 108/63 107/68 117/77  Pulse: (!) 52 61 (!) 52 (!) 52  Resp: 18 16 16 16   Temp: 98.1 F (36.7 C) 98.4 F (36.9 C) 98.2 F (36.8 C) 97.6 F (36.4 C)  TempSrc: Oral Oral Oral Oral  SpO2: 100% 99% 100% 100%  Weight:      Height:        NAD  Abdomen:  soft, appropriate tenderness, pressure dressing in place ext:    Symmetric, no edema bilaterally  Lab Results  Component Value Date   WBC 9.5 10/04/2022   HGB 11.1 (L) 10/04/2022   HCT 33.4 (L) 10/04/2022   MCV 87.2 10/04/2022   PLT 124 (L) 10/04/2022    --/--/A POS (08/05 1012)/  A/P    27 y.o. [id number] POD #1 s/p elective RCS at 38 weeks with di-di twins Routine post op and postpartum care.   Reviewed pain management options--PO oxycodone available prn Shower and remove pressure dressing today Anticipate discharge in 1-2 days

## 2022-10-05 NOTE — Lactation Note (Signed)
This note was copied from a baby's chart. Lactation Consultation Note Attempted to see mom but everyone was sleeping.  Patient Name: Kristina Robinson UUVOZ'D Date: 10/05/2022 Age:25 hours     Maternal Data    Feeding    LATCH Score                    Lactation Tools Discussed/Used    Interventions    Discharge    Consult Status      Charyl Dancer 10/05/2022, 1:09 AM

## 2022-10-05 NOTE — Discharge Summary (Signed)
Postpartum Discharge Summary     Patient Name: Kristina Robinson DOB: [birth year] MRN: [id number]  Date of admission: 10/03/2022 Delivery date:   Kristina, Robinson [[id number]]  10/03/2022    Kristina, Robinson [[id number]]  10/03/2022 Delivering provider:    Darshay, Robinson [[id number]]  Kristina, MICHELLE Sheela, Robinson [[id number]]  Kristina Robinson Date of discharge: 10/05/2022  Admitting diagnosis: History of cesarean delivery [Z98.891] desired repeat for di/di tiwn Intrauterine pregnancy: [redacted]w[redacted]d     Secondary diagnosis:  Principal Problem:   History of cesarean delivery  Additional problems: pregnancy associated anemia, not clinically significant    Discharge diagnosis: Term Pregnancy Delivered                                              Post partum procedures: none Augmentation: N/A Complications: None  Hospital course: Sceduled C/S   27 y.o. yo [id number] at [redacted]w[redacted]d was admitted to the hospital 10/03/2022 for scheduled cesarean section with the following indication:Elective Repeat and Multifetal Gestation.Delivery details are as follows:  Membrane Rupture Time/Date:    Kristina, Robinson [[id number]]  12:12 PM    Kristina, Robinson [[id number]]  12:15 PM,   Kristina, Robinson [[id number]]  10/03/2022    Kristina, Robinson [[id number]]  10/03/2022  Delivery Method:   Kristina, Robinson [[id number]]  C-Section, Low Transverse    Kristina, Robinson [[id number]]  C-Section, Low Transverse Operative Delivery:N/A Details of operation can be found in separate operative note.  Patient had a postpartum course complicated bynothing.  She is ambulating, tolerating a regular diet, passing flatus, and urinating well. Patient is discharged home in stable condition on  10/05/22        Newborn Data: Birth date:   Kristina, Robinson [[id number]]  10/03/2022    Kristina, Robinson [[id number]]   10/03/2022 Birth time:   Kristina, Robinson [[id number]]  12:13 PM    Kristina, Robinson [[id number]]  12:15 PM Gender:   Kristina, Robinson [[id number]]  Female    Kristina, Robinson [[id number]]  Female Living status:   Kristina, Robinson [[id number]]  Living    Kristina, Robinson [[id number]]  Living Apgars:   Kristina, Robinson [[id number]]  [address] [[id number]]  Kristina, Robinson [[id number]]  [address] [place] [[id number]]  9  Weight:   Kristina, Robinson [[id number]]  3330 g    Kristina, Robinson [[id number]]  2810 g    Magnesium Sulfate received: No BMZ received: No Rhophylac:N/A Transfusion:No  Physical exam  Vitals:   10/04/22 0500 10/04/22 1435 10/04/22 2227 10/05/22 0511  BP: 117/77 109/74 106/67 113/76  Pulse: (!) 52 61 (!) 56 (!) 57  Resp: 16 16 16 19   Temp: 97.6 F (36.4 C) 98.1 F (36.7 C) 97.8 F (36.6 C) 98 F (36.7 C)  TempSrc: Oral Oral Oral Oral  SpO2: 100% 97% 99% 99%  Weight:      Height:       General: alert, cooperative, and no distress Lochia: appropriate Uterine Fundus: firm Incision: Healing well with no significant drainage, No significant erythema, Dressing is clean, dry, and intact DVT Evaluation: No evidence of DVT seen on physical exam. Labs: Lab Results  Component Value Date   WBC 9.5 10/04/2022  HGB 11.1 (L) 10/04/2022   HCT 33.4 (L) 10/04/2022   MCV 87.2 10/04/2022   PLT 124 (L) 10/04/2022      Latest Ref Rng & Units 01/11/2020   11:06 PM  CMP  Glucose 70 - 99 mg/dL 96   BUN 6 - 20 mg/dL 6   Creatinine 7.82 - 9.56 mg/dL 2.13   Sodium 086 - 578 mmol/L 135   Potassium 3.5 - 5.1 mmol/L 3.6   Chloride 98 - 111 mmol/L 103   CO2 22 - 32 mmol/L 24   Calcium 8.9 - 10.3 mg/dL 8.8   Total Protein 6.5 - 8.1 g/dL 5.7   Total Bilirubin 0.3 - 1.2 mg/dL 0.5   Alkaline Phos 38 - 126 U/L 73   AST 15 - 41 U/L 16   ALT  0 - 44 U/L 13    Edinburgh Score:    10/03/2022    3:00 PM  Edinburgh Postnatal Depression Scale Screening Tool  I have been able to laugh and see the funny side of things. 0  I have looked forward with enjoyment to things. 0  I have blamed myself unnecessarily when things went wrong. 0  I have been anxious or worried for no good reason. 0  I have felt scared or panicky for no good reason. 0  Things have been getting on top of me. 0  I have been so unhappy that I have had difficulty sleeping. 0  I have felt sad or miserable. 0  I have been so unhappy that I have been crying. 0  The thought of harming myself has occurred to me. 0  Edinburgh Postnatal Depression Scale Total 0      After visit meds:  Allergies as of 10/05/2022       Reactions   Ondansetron Other (See Comments)   Dystonic reaction 12/26/2021   Promethazine Other (See Comments)   Dystonic reaction        Medication List     STOP taking these medications    acetaminophen 325 MG tablet Commonly known as: Tylenol   PrePLUS 27-1 MG Tabs               Discharge Care Instructions  (From admission, onward)           Start     Ordered   10/05/22 0000  Discharge wound care:       Comments: Can remove dressing and steri strips 5 days from date of surgery   10/05/22 1133             Discharge home in stable condition  Infant Disposition:home with mother Discharge instruction: per After Visit Summary and Postpartum booklet. Activity: Advance as tolerated. Pelvic rest for 6 weeks.  Diet: routine diet Anticipated Birth Control: Unsure Postpartum Appointment:4 weeks Additional Postpartum F/U: Postpartum Depression checkup Future Appointments:No future appointments. Follow up Visit:  Follow-up Information     [name], MD Follow up in 4 week(s).   Specialty: Obstetrics and Gynecology Contact information: [address] [place] [address] [phone]                      10/05/2022 [name], DO

## 2022-10-05 NOTE — Lactation Note (Signed)
This note was copied from a baby's chart. Lactation Consultation Note  Patient Name: Kristina Robinson [id number]'[id number] Date: 10/05/2022 Age:25 hours Reason for consult: Follow-up assessment;Early term 37-38.6wks;Infant weight loss;Exclusive pumping and bottle feeding (4 % weight loss [name]. per mom has decided to pump and provided EBM and formula due to having a 10 and 25 year old at home)  Maternal Data    Feeding Mother's Current Feeding Choice: Breast Milk and Formula Nipple Type: Slow - flow      Interventions Interventions: Breast feeding basics reviewed;Education;LC Services brochure  Discharge Discharge Education: Engorgement and breast care Pump: Personal;DEBP  Consult Status Consult Status: Complete Date: 10/05/22    Matilde Sprang Seng Fouts 10/05/2022, 11:15 AM

## 2022-10-05 NOTE — Lactation Note (Signed)
This note was copied from a baby's chart. Lactation Consultation Note  Patient Name: Kristina Robinson ZOXWR'U Date: 10/05/2022 Age:25 hours Reason for consult: Follow-up assessment;Early term 37-38.6wks;Infant weight loss;Exclusive pumping and bottle feeding (4 % weight loss [name]. per mom has decided to pump and provided EBM and formula due to having a 53 and 25 year old at home)   Maternal Data    Feeding Mother's Current Feeding Choice: Breast Milk and Formula Nipple Type: Slow - flow  Interventions Interventions: Breast feeding basics reviewed;Education;LC Services brochure  Discharge Discharge Education: Engorgement and breast care Pump: Personal;DEBP  Consult Status Consult Status: Complete Date: 10/05/22    Kristina Robinson 10/05/2022, 11:16 AM

## 2022-10-05 NOTE — Progress Notes (Signed)
Pt called [facility] after being discharged home stating she had previously declined pain medication prescriptions at discharge, but now has decided she desires pain medication to be ordered.  RN called and notified Dr. Claiborne Billings.  Provider in OR, states she will order once out of procedure.

## 2022-10-05 NOTE — Lactation Note (Signed)
This note was copied from a baby's chart. Lactation Consultation Note  Patient Name: Eren Ricciuti AVWUJ'W Date: 10/05/2022 Age:25 hours Reason for consult: Follow-up assessment;Infant weight loss;Early term 37-38.6wks;Exclusive pumping and bottle feeding LC reviewed engorgement prevention and tx, supply and demand.   Maternal Data    Feeding Mother's Current Feeding Choice: Breast Milk and Formula  Discharge Discharge Education: Engorgement and breast care Pump: DEBP;Personal  Consult Status Consult Status: Complete    Matilde Sprang Prescott Truex 10/05/2022, 11:17 AM

## 2022-10-26 ENCOUNTER — Telehealth (HOSPITAL_COMMUNITY): Payer: Self-pay

## 2022-10-26 NOTE — Telephone Encounter (Signed)
10/26/2022 1824  Name: Tarnesha Fenoglio MRN: [id number] DOB: [birth year]  Reason for Call:  Transition of Care Hospital Discharge Call  Contact Status: Patient Contact Status: Unable to contact Mailbox is full, not able to leave voicemail   Language assistant needed: Interpreter Mode: Interpreter Not Needed        Follow-Up Questions:    Inocente Salles Postnatal Depression Scale:  In the Past 7 Days:    PHQ2-9 Depression Scale:     Discharge Follow-up:    Post-discharge interventions: NA  Signature  Signe Colt
